# Patient Record
Sex: Female | Born: 1935 | Race: White | Hispanic: No | Marital: Married
Health system: Southern US, Community
[De-identification: ages and names within clinical notes are randomized; demographics above are authoritative.]

## PROBLEM LIST (undated history)

## (undated) DIAGNOSIS — I10 Essential (primary) hypertension: Secondary | ICD-10-CM

---

## 2017-11-19 ENCOUNTER — Encounter (HOSPITAL_COMMUNITY): Payer: Self-pay | Admitting: Pharmacy Technician

## 2017-11-19 ENCOUNTER — Inpatient Hospital Stay (HOSPITAL_COMMUNITY)
Admission: EM | Admit: 2017-11-19 | Discharge: 2017-11-22 | DRG: 698 | Disposition: A | Discharge status: Home / Self Care | Payer: Medicare Other | Attending: Student in an Organized Health Care Education/Training Program | Admitting: Student in an Organized Health Care Education/Training Program

## 2017-11-19 ENCOUNTER — Other Ambulatory Visit: Payer: Self-pay

## 2017-11-19 ENCOUNTER — Emergency Department (HOSPITAL_COMMUNITY): Payer: Medicare Other

## 2017-11-19 DIAGNOSIS — Y846 Urinary catheterization as the cause of abnormal reaction of the patient, or of later complication, without mention of misadventure at the time of the procedure: Secondary | ICD-10-CM | POA: Diagnosis present

## 2017-11-19 DIAGNOSIS — I10 Essential (primary) hypertension: Secondary | ICD-10-CM

## 2017-11-19 DIAGNOSIS — J9811 Atelectasis: Secondary | ICD-10-CM | POA: Diagnosis present

## 2017-11-19 DIAGNOSIS — Z87440 Personal history of urinary (tract) infections: Secondary | ICD-10-CM

## 2017-11-19 DIAGNOSIS — R509 Fever, unspecified: Secondary | ICD-10-CM | POA: Diagnosis not present

## 2017-11-19 DIAGNOSIS — J90 Pleural effusion, not elsewhere classified: Secondary | ICD-10-CM | POA: Diagnosis present

## 2017-11-19 DIAGNOSIS — T83518A Infection and inflammatory reaction due to other urinary catheter, initial encounter: Secondary | ICD-10-CM | POA: Diagnosis not present

## 2017-11-19 DIAGNOSIS — R32 Unspecified urinary incontinence: Secondary | ICD-10-CM | POA: Diagnosis present

## 2017-11-19 DIAGNOSIS — R7881 Bacteremia: Secondary | ICD-10-CM

## 2017-11-19 DIAGNOSIS — N3000 Acute cystitis without hematuria: Secondary | ICD-10-CM

## 2017-11-19 DIAGNOSIS — N390 Urinary tract infection, site not specified: Secondary | ICD-10-CM

## 2017-11-19 DIAGNOSIS — C9211 Chronic myeloid leukemia, BCR/ABL-positive, in remission: Secondary | ICD-10-CM | POA: Diagnosis present

## 2017-11-19 DIAGNOSIS — A419 Sepsis, unspecified organism: Secondary | ICD-10-CM

## 2017-11-19 DIAGNOSIS — Z79899 Other long term (current) drug therapy: Secondary | ICD-10-CM

## 2017-11-19 DIAGNOSIS — A4151 Sepsis due to Escherichia coli [E. coli]: Secondary | ICD-10-CM | POA: Diagnosis present

## 2017-11-19 LAB — COMPREHENSIVE METABOLIC PANEL
ALT: 12 U/L (ref 0–44)
ANION GAP: 5 (ref 5–15)
AST: 19 U/L (ref 15–41)
Albumin: 2.6 g/dL — ABNORMAL LOW (ref 3.5–5.0)
Alkaline Phosphatase: 66 U/L (ref 38–126)
BUN: 7 mg/dL — AB (ref 8–23)
CHLORIDE: 107 mmol/L (ref 98–111)
CO2: 22 mmol/L (ref 22–32)
Calcium: 7.8 mg/dL — ABNORMAL LOW (ref 8.9–10.3)
Creatinine, Ser: 0.83 mg/dL (ref 0.44–1.00)
Glucose, Bld: 124 mg/dL — ABNORMAL HIGH (ref 70–99)
POTASSIUM: 3.5 mmol/L (ref 3.5–5.1)
Sodium: 134 mmol/L — ABNORMAL LOW (ref 135–145)
Total Bilirubin: 0.9 mg/dL (ref 0.3–1.2)
Total Protein: 5.2 g/dL — ABNORMAL LOW (ref 6.5–8.1)

## 2017-11-19 LAB — CBC WITH DIFFERENTIAL/PLATELET
ABS IMMATURE GRANULOCYTES: 0.04 10*3/uL (ref 0.00–0.07)
BASOS ABS: 0 10*3/uL (ref 0.0–0.1)
BASOS PCT: 1 %
Eosinophils Absolute: 0.1 10*3/uL (ref 0.0–0.5)
Eosinophils Relative: 1 %
HCT: 31.7 % — ABNORMAL LOW (ref 36.0–46.0)
HEMOGLOBIN: 9.9 g/dL — AB (ref 12.0–15.0)
IMMATURE GRANULOCYTES: 1 %
Lymphocytes Relative: 2 %
Lymphs Abs: 0.2 10*3/uL — ABNORMAL LOW (ref 0.7–4.0)
MCH: 28.5 pg (ref 26.0–34.0)
MCHC: 31.2 g/dL (ref 30.0–36.0)
MCV: 91.4 fL (ref 80.0–100.0)
Monocytes Absolute: 0.3 10*3/uL (ref 0.1–1.0)
Monocytes Relative: 4 %
NEUTROS ABS: 6.7 10*3/uL (ref 1.7–7.7)
NEUTROS PCT: 91 %
NRBC: 0 % (ref 0.0–0.2)
PLATELETS: 251 10*3/uL (ref 150–400)
RBC: 3.47 MIL/uL — AB (ref 3.87–5.11)
RDW: 13.4 % (ref 11.5–15.5)
WBC: 7.4 10*3/uL (ref 4.0–10.5)

## 2017-11-19 LAB — I-STAT CG4 LACTIC ACID, ED: LACTIC ACID, VENOUS: 1.24 mmol/L (ref 0.5–1.9)

## 2017-11-19 MED ORDER — SODIUM CHLORIDE 0.9 % IV SOLN
2.0000 g | Freq: Once | INTRAVENOUS | Status: AC
  Administered 2017-11-19: 2 g via INTRAVENOUS
  Filled 2017-11-19: qty 2

## 2017-11-19 MED ORDER — LACTATED RINGERS IV BOLUS
1000.0000 mL | Freq: Once | INTRAVENOUS | Status: AC
  Administered 2017-11-19: 1000 mL via INTRAVENOUS

## 2017-11-19 MED ORDER — VANCOMYCIN HCL IN DEXTROSE 1-5 GM/200ML-% IV SOLN
1000.0000 mg | Freq: Once | INTRAVENOUS | Status: AC
  Administered 2017-11-20: 1000 mg via INTRAVENOUS
  Filled 2017-11-19: qty 200

## 2017-11-19 MED ORDER — VANCOMYCIN HCL 500 MG IV SOLR
500.0000 mg | Freq: Two times a day (BID) | INTRAVENOUS | Status: DC
  Administered 2017-11-20: 500 mg via INTRAVENOUS
  Filled 2017-11-19 (×2): qty 500

## 2017-11-19 MED ORDER — SODIUM CHLORIDE 0.9 % IV SOLN
2.0000 g | INTRAVENOUS | Status: DC
  Filled 2017-11-19: qty 2

## 2017-11-19 MED ORDER — IOHEXOL 300 MG/ML  SOLN
100.0000 mL | Freq: Once | INTRAMUSCULAR | Status: AC | PRN
  Administered 2017-11-19: 100 mL via INTRAVENOUS

## 2017-11-19 NOTE — ED Notes (Signed)
Attempted to draw blood culture x2

## 2017-11-19 NOTE — Progress Notes (Signed)
Pharmacy Antibiotic Note  Summer Stephenson is a 84 y.o. female admitted on 11/19/2017 with sepsis secondary to possible UTI.  Pharmacy has been consulted for cefepime and vancomycin dosing.  Currently, febrile (Tmax 103.4), WBC 7.4, CrCl 47.9 mL/min.   Plan: Cefepime 2 gm IV q24h Vancomycin 1 gm IV once, then 500 mg IV q12h Monitor renal function F/u cultures and length of therapy   Height: 5\' 4"  (162.6 cm) Weight: 145 lb (65.8 kg) IBW/kg (Calculated) : 54.7  Temp (24hrs), Avg:103.4 F (39.7 C), Min:103.4 F (39.7 C), Max:103.4 F (39.7 C)  No results for input(s): WBC, CREATININE, LATICACIDVEN, VANCOTROUGH, VANCOPEAK, VANCORANDOM, GENTTROUGH, GENTPEAK, GENTRANDOM, TOBRATROUGH, TOBRAPEAK, TOBRARND, AMIKACINPEAK, AMIKACINTROU, AMIKACIN in the last 168 hours.  CrCl cannot be calculated (No successful lab value found.).    Allergies not on file  Antimicrobials this admission: Cefepime 11/1>> Vancomycin 11/1>>   Microbiology results: 11/1 BCx: 11/1 UCx:  Thank you for allowing pharmacy to be a part of this patient's care.  Willia Craze, Pharmacy Student

## 2017-11-19 NOTE — ED Provider Notes (Signed)
[facility] EMERGENCY DEPARTMENT Provider Note   CSN: [id number] Arrival date & time: 11/19/17  2115     History   Chief Complaint Chief Complaint  Patient presents with  . Fever    HPI Summer Stephenson is a 84 y.o. female.  The history is provided by the patient.  Fever   This is a new problem. The current episode started 6 to 12 hours ago. The problem occurs constantly. The problem has not changed since onset.The maximum temperature noted was 103 to 104 F. Associated symptoms include muscle aches. Pertinent negatives include no chest pain, no fussiness, no sleepiness, no diarrhea, no vomiting, no congestion, no headaches, no sore throat, no tugging at ear and no cough. She has tried nothing for the symptoms. The treatment provided no relief.    History reviewed. No pertinent past medical history.  Patient Active Problem List   Diagnosis Date Noted  . Sepsis ([place]) 11/20/2017    History reviewed. No pertinent surgical history.   OB History   None      Home Medications    Prior to Admission medications   Medication Sig Start Date End Date Taking? Authorizing Provider  acetaminophen (TYLENOL) 325 MG tablet Take 650 mg by mouth every 6 (six) hours as needed for fever.   Yes [provider]  denosumab (PROLIA) 60 MG/ML SOSY injection Inject 60 mg into the skin every 6 (six) months.   Yes [provider]  gabapentin (NEURONTIN) 400 MG capsule Take 400 mg by mouth 2 (two) times daily. 10/20/17  Yes [provider]  lisinopril (PRINIVIL,ZESTRIL) 10 MG tablet Take 10 mg by mouth daily. 09/24/17  Yes [provider]  metoprolol succinate (TOPROL-XL) 25 MG 24 hr tablet Take 25 mg by mouth at bedtime. 10/20/17  Yes [provider]    Family History No family history on file.  Social History Social History   Tobacco Use  . Smoking status: Not on file  Substance Use Topics  . Alcohol use: Not on file  . Drug use: Not  on file     Allergies   Patient has no known allergies.   Review of Systems Review of Systems  Constitutional: Positive for fever. Negative for chills.  HENT: Negative for congestion, ear pain and sore throat.   Eyes: Negative for pain and visual disturbance.  Respiratory: Negative for cough and shortness of breath.   Cardiovascular: Negative for chest pain and palpitations.  Gastrointestinal: Negative for abdominal pain, diarrhea and vomiting.  Genitourinary: Negative for difficulty urinating, dyspareunia, dysuria, flank pain, frequency, hematuria, pelvic pain, urgency, vaginal discharge and vaginal pain.       Suprapubic catheter since recent surgery while in [place] for bladder/vagina fistula repair  Musculoskeletal: Negative for arthralgias and back pain.  Skin: Negative for color change and rash.  Neurological: Negative for seizures, syncope and headaches.  All other systems reviewed and are negative.    Physical Exam Updated Vital Signs  ED Triage Vitals  Enc Vitals Group     BP 11/19/17 2130 (!) 134/56     Pulse Rate 11/19/17 2130 (!) 102     Resp 11/19/17 2130 15     Temp 11/19/17 2133 (!) 103.4 F (39.7 C)     Temp Source 11/19/17 2133 Rectal     SpO2 11/19/17 2130 94 %     Weight 11/19/17 2134 145 lb (65.8 kg)     Height 11/19/17 2134 5\' 4"  (1.626 m)  Head Circumference --      Peak Flow --      Pain Score 11/19/17 2134 6     Pain Loc --      Pain Edu? --      Excl. in [place]? --     Physical Exam  Constitutional: She is oriented to person, place, and time. She appears well-developed and well-nourished. No distress.  HENT:  Head: Normocephalic and atraumatic.  Mouth/Throat: No oropharyngeal exudate.  Eyes: Pupils are equal, round, and reactive to light. Conjunctivae and EOM are normal.  Neck: Normal range of motion. Neck supple.  Cardiovascular: Regular rhythm, normal heart sounds and intact distal pulses. Tachycardia present.  No murmur  heard. Pulmonary/Chest: Effort normal and breath sounds normal. No respiratory distress.  Abdominal: Soft. She exhibits no distension. There is no tenderness.  Suprapubic catheter in place, well-healing surgical scars.  No signs of drainage around surgical site, no fluctuance, no abscess  Musculoskeletal: Normal range of motion. She exhibits no edema.  Neurological: She is alert and oriented to person, place, and time.  Skin: Skin is warm and dry. Capillary refill takes less than 2 seconds.  Psychiatric: She has a normal mood and affect.  Nursing note and vitals reviewed.    ED Treatments / Results  Labs (all labs ordered are listed, but only abnormal results are displayed) Labs Reviewed  COMPREHENSIVE METABOLIC PANEL - Abnormal; Notable for the following components:      Result Value   Sodium 134 (*)    Glucose, Bld 124 (*)    BUN 7 (*)    Calcium 7.8 (*)    Total Protein 5.2 (*)    Albumin 2.6 (*)    All other components within normal limits  CBC WITH DIFFERENTIAL/PLATELET - Abnormal; Notable for the following components:   RBC 3.47 (*)    Hemoglobin 9.9 (*)    HCT 31.7 (*)    Lymphs Abs 0.2 (*)    All other components within normal limits  URINALYSIS, ROUTINE W REFLEX MICROSCOPIC - Abnormal; Notable for the following components:   APPearance HAZY (*)    Hgb urine dipstick LARGE (*)    Protein, ur 30 (*)    Nitrite POSITIVE (*)    Leukocytes, UA LARGE (*)    RBC / HPF >50 (*)    WBC, UA >50 (*)    Bacteria, UA MANY (*)    All other components within normal limits  I-STAT CG4 LACTIC ACID, ED - Abnormal; Notable for the following components:   Lactic Acid, Venous 0.49 (*)    All other components within normal limits  CULTURE, BLOOD (ROUTINE X 2)  CULTURE, BLOOD (ROUTINE X 2)  URINE CULTURE  INFLUENZA PANEL BY PCR (TYPE A & B)  LIPASE, BLOOD  CBC  CREATININE, SERUM  BASIC METABOLIC PANEL  CBC  I-STAT CG4 LACTIC ACID, ED    EKG None  Radiology Dg Chest 2  View  Result Date: 11/20/2017 CLINICAL DATA:  84 year old female with fever EXAM: CHEST - 2 VIEW COMPARISON:  CT of the abdomen pelvis dated 11/19/2017 FINDINGS: Left lung base density may represent atelectasis versus infiltrate. Probable trace left pleural effusion. No pneumothorax. The cardiac silhouette is within normal limits. No acute osseous pathology. IMPRESSION: Left lung base atelectasis versus infiltrate. Electronically Signed   By: Anner Crete M.D.   On: 11/20/2017 00:06   Ct Abdomen Pelvis W Contrast  Result Date: 11/19/2017 CLINICAL DATA:  84 year old female with acute abdominal pain. Fever  and nausea. EXAM: CT ABDOMEN AND PELVIS WITH CONTRAST TECHNIQUE: Multidetector CT imaging of the abdomen and pelvis was performed using the standard protocol following bolus administration of intravenous contrast. CONTRAST:  134mL OMNIPAQUE IOHEXOL 300 MG/ML  SOLN COMPARISON:  None. FINDINGS: Evaluation of this exam is limited in the absence of intravenous contrast. Lower chest: A patchy area of subsegmental consolidation at the left lung base may represent atelectasis or infiltrate. There are otherwise bibasilar linear atelectasis/scarring. No intra-abdominal free air or free fluid. Hepatobiliary: Diffuse fatty infiltration of the liver. No intrahepatic biliary ductal dilatation. Cholecystectomy. Pancreas: There is moderate fatty atrophy of the pancreas. Minimal stranding may be present in the region of the uncinate process of the pancreas. Correlation with pancreatic enzymes recommended to exclude pancreatitis. No drainable fluid collection/abscess or pseudocyst. Spleen: Normal in size without focal abnormality. Adrenals/Urinary Tract: The adrenal glands are unremarkable. There is no hydronephrosis on either side. Subcentimeter and ill-defined right renal hypodense lesions are too small to characterize. There is symmetric enhancement and excretion of contrast by both kidneys. Bilateral pigtail ureteral  stents with proximal tip in the region of the ureteropelvic junctions and distal end within the urinary bladder. The urinary bladder is collapsed around a suprapubic catheter. Diffuse thickened appearance of the bladder wall likely partly related to underdistention and possibly secondary to chronic infection. Clinical correlation is recommended. Stomach/Bowel: There is a small hiatal hernia. There is colonic diverticulosis without active inflammatory changes. There is no bowel obstruction or active inflammation. Normal appendix. Vascular/Lymphatic: Mild aortoiliac atherosclerotic disease. No portal venous gas. There is no adenopathy. Reproductive: Hysterectomy. No pelvic mass. Other: Midline vertical anterior pelvic wall incisional scar and a small fat containing umbilical hernia. Musculoskeletal: L4-L5 disc spacer. No acute osseous pathology. IMPRESSION: 1. Minimal stranding in the region of the uncinate process of the pancreas. Correlation with pancreatic enzymes recommended to exclude pancreatitis. 2. Fatty liver. 3. Colonic diverticulosis without active inflammatory changes. No bowel obstruction or active inflammation. Normal appendix. 4. Bilateral pigtail ureteral stents with proximal tip in the region of the ureteropelvic junctions and distal end within the urinary bladder. No hydronephrosis on either side. 5. Left lung base subsegmental atelectasis or infiltrate. Electronically Signed   By: Anner Crete M.D.   On: 11/19/2017 23:49    Procedures .Critical Care Performed by: Lennice Sites, DO Authorized by: Lennice Sites, DO   Critical care provider statement:    Critical care time (minutes):  55   Critical care time was exclusive of:  Separately billable procedures and treating other patients and teaching time   Critical care was necessary to treat or prevent imminent or life-threatening deterioration of the following conditions:  Sepsis   Critical care was time spent personally by me on the  following activities:  Development of treatment plan with patient or surrogate, discussions with primary provider, evaluation of patient's response to treatment, examination of patient, obtaining history from patient or surrogate, ordering and performing treatments and interventions, ordering and review of laboratory studies, ordering and review of radiographic studies, pulse oximetry, re-evaluation of patient's condition and review of old charts   I assumed direction of critical care for this patient from another provider in my specialty: no     (including critical care time)  Medications Ordered in ED Medications  vancomycin (VANCOCIN) IVPB 1000 mg/200 mL premix (has no administration in time range)  ceFEPIme (MAXIPIME) 2 g in sodium chloride 0.9 % 100 mL IVPB (has no administration in time range)  vancomycin (VANCOCIN) 500  mg in sodium chloride 0.9 % 100 mL IVPB (has no administration in time range)  enoxaparin (LOVENOX) injection 40 mg (has no administration in time range)  acetaminophen (TYLENOL) tablet 650 mg (has no administration in time range)    Or  acetaminophen (TYLENOL) suppository 650 mg (has no administration in time range)  polyethylene glycol (MIRALAX / GLYCOLAX) packet 17 g (has no administration in time range)  ondansetron (ZOFRAN) tablet 4 mg (has no administration in time range)    Or  ondansetron (ZOFRAN) injection 4 mg (has no administration in time range)  lactated ringers bolus 1,000 mL (0 mLs Intravenous Stopped 11/20/17 0034)  ceFEPIme (MAXIPIME) 2 g in sodium chloride 0.9 % 100 mL IVPB (0 g Intravenous Stopped 11/20/17 0034)  iohexol (OMNIPAQUE) 300 MG/ML solution 100 mL (100 mLs Intravenous Contrast Given 11/19/17 2311)     Initial Impression / Assessment and Plan / ED Course  I have reviewed the triage vital signs and the nursing notes.  Pertinent labs & imaging results that were available during my care of the patient were reviewed by me and considered in my  medical decision making (see chart for details).     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  Patient with concern for sepsis.  Has no abdominal tenderness.  Has a urine leg bag attached to her suprapubic catheter that has cloudy urine.  Surgical site is well-appearing.  No abdominal tenderness on exam.  Patient with no cough, no sputum production.  Clear breath sounds.  Given fever and tachycardia and recent surgery patient was empirically started on IV cefepime and IV vancomycin.  Patient was started on lactated Ringer bolus.  Patient had normal blood pressure.  Lactic acid within normal limits.  No significant leukocytosis.  Urinalysis showing signs of urinary tract infection.  Urine culture sent.  Chest x-ray with possible pneumonia.  However clinically likely not pneumonia.  CT scan of the abdomen and pelvis showed no acute findings.  No abscesses or other acute intra-abdominal infectious process.  Patient with some inflammation around the pancreas and will obtain lipase.  Patient otherwise with no significant anemia, electrolyte abnormality, kidney injury.  Flu test pending.  Blood cultures pending.  Patient hemodynamically stable throughout my care.  Patient admitted to internal medicine service for further sepsis work-up.  Medical records from [facility] were obtained and given to inpatient team, likely will need to discuss patient with her surgeon Dr. Lunette Stands over the weekend.  This chart was dictated using voice recognition software.  Despite best efforts to proofread,  errors can occur which can change the documentation meaning.   Final Clinical Impressions(s) / ED Diagnoses   Final diagnoses:  Sepsis, due to unspecified organism, unspecified whether acute organ dysfunction present [facility])  Acute cystitis  without hematuria    ED Discharge Orders    None       Lennice Sites, DO 11/20/17 0117

## 2017-11-19 NOTE — ED Notes (Signed)
Patient transported to CT 

## 2017-11-19 NOTE — ED Triage Notes (Signed)
Pt arrives via ems from home with reports of possible sepsis. NV and ams. Pt recently had bladder mesh removed and urinary catheter placed. 700cc fluid given en route and 4mg  zofran pt with brown emesis with ems. Pt alert to person place and event. 146/72, HR 104, RR 20, capnography 25, 92% RA.

## 2017-11-20 ENCOUNTER — Other Ambulatory Visit: Payer: Self-pay

## 2017-11-20 DIAGNOSIS — J90 Pleural effusion, not elsewhere classified: Secondary | ICD-10-CM | POA: Diagnosis present

## 2017-11-20 DIAGNOSIS — R509 Fever, unspecified: Secondary | ICD-10-CM | POA: Diagnosis present

## 2017-11-20 DIAGNOSIS — J9811 Atelectasis: Secondary | ICD-10-CM | POA: Diagnosis present

## 2017-11-20 DIAGNOSIS — I10 Essential (primary) hypertension: Secondary | ICD-10-CM

## 2017-11-20 DIAGNOSIS — B9620 Unspecified Escherichia coli [E. coli] as the cause of diseases classified elsewhere: Secondary | ICD-10-CM | POA: Diagnosis not present

## 2017-11-20 DIAGNOSIS — Y846 Urinary catheterization as the cause of abnormal reaction of the patient, or of later complication, without mention of misadventure at the time of the procedure: Secondary | ICD-10-CM | POA: Diagnosis present

## 2017-11-20 DIAGNOSIS — R32 Unspecified urinary incontinence: Secondary | ICD-10-CM | POA: Diagnosis present

## 2017-11-20 DIAGNOSIS — N390 Urinary tract infection, site not specified: Secondary | ICD-10-CM | POA: Diagnosis present

## 2017-11-20 DIAGNOSIS — A4151 Sepsis due to Escherichia coli [E. coli]: Secondary | ICD-10-CM | POA: Diagnosis present

## 2017-11-20 DIAGNOSIS — N8110 Cystocele, unspecified: Secondary | ICD-10-CM | POA: Insufficient documentation

## 2017-11-20 DIAGNOSIS — Z79899 Other long term (current) drug therapy: Secondary | ICD-10-CM | POA: Diagnosis not present

## 2017-11-20 DIAGNOSIS — Z9889 Other specified postprocedural states: Secondary | ICD-10-CM

## 2017-11-20 DIAGNOSIS — Z8742 Personal history of other diseases of the female genital tract: Secondary | ICD-10-CM

## 2017-11-20 DIAGNOSIS — R7881 Bacteremia: Secondary | ICD-10-CM | POA: Diagnosis not present

## 2017-11-20 DIAGNOSIS — C9211 Chronic myeloid leukemia, BCR/ABL-positive, in remission: Secondary | ICD-10-CM | POA: Diagnosis present

## 2017-11-20 DIAGNOSIS — T83518A Infection and inflammatory reaction due to other urinary catheter, initial encounter: Secondary | ICD-10-CM | POA: Diagnosis present

## 2017-11-20 DIAGNOSIS — T83511A Infection and inflammatory reaction due to indwelling urethral catheter, initial encounter: Secondary | ICD-10-CM | POA: Diagnosis not present

## 2017-11-20 DIAGNOSIS — Z87440 Personal history of urinary (tract) infections: Secondary | ICD-10-CM | POA: Diagnosis not present

## 2017-11-20 DIAGNOSIS — A419 Sepsis, unspecified organism: Secondary | ICD-10-CM | POA: Diagnosis present

## 2017-11-20 DIAGNOSIS — N3942 Incontinence without sensory awareness: Secondary | ICD-10-CM

## 2017-11-20 DIAGNOSIS — Z960 Presence of urogenital implants: Secondary | ICD-10-CM | POA: Insufficient documentation

## 2017-11-20 DIAGNOSIS — C9210 Chronic myeloid leukemia, BCR/ABL-positive, not having achieved remission: Secondary | ICD-10-CM | POA: Insufficient documentation

## 2017-11-20 LAB — URINALYSIS, ROUTINE W REFLEX MICROSCOPIC
BILIRUBIN URINE: NEGATIVE
Glucose, UA: NEGATIVE mg/dL
KETONES UR: NEGATIVE mg/dL
Nitrite: POSITIVE — AB
PH: 7 (ref 5.0–8.0)
PROTEIN: 30 mg/dL — AB
Specific Gravity, Urine: 1.011 (ref 1.005–1.030)

## 2017-11-20 LAB — LIPASE, BLOOD: Lipase: 20 U/L (ref 11–51)

## 2017-11-20 LAB — BLOOD CULTURE ID PANEL (REFLEXED)
ACINETOBACTER BAUMANNII: NOT DETECTED
CANDIDA ALBICANS: NOT DETECTED
CANDIDA PARAPSILOSIS: NOT DETECTED
Candida glabrata: NOT DETECTED
Candida krusei: NOT DETECTED
Candida tropicalis: NOT DETECTED
Carbapenem resistance: NOT DETECTED
Enterobacter cloacae complex: NOT DETECTED
Enterobacteriaceae species: DETECTED — AB
Enterococcus species: NOT DETECTED
Escherichia coli: DETECTED — AB
HAEMOPHILUS INFLUENZAE: NOT DETECTED
KLEBSIELLA PNEUMONIAE: NOT DETECTED
Klebsiella oxytoca: NOT DETECTED
Listeria monocytogenes: NOT DETECTED
NEISSERIA MENINGITIDIS: NOT DETECTED
PROTEUS SPECIES: NOT DETECTED
Pseudomonas aeruginosa: NOT DETECTED
STAPHYLOCOCCUS AUREUS BCID: NOT DETECTED
STREPTOCOCCUS AGALACTIAE: NOT DETECTED
STREPTOCOCCUS SPECIES: NOT DETECTED
Serratia marcescens: NOT DETECTED
Staphylococcus species: NOT DETECTED
Streptococcus pneumoniae: NOT DETECTED
Streptococcus pyogenes: NOT DETECTED

## 2017-11-20 LAB — CBC
HEMATOCRIT: 28.9 % — AB (ref 36.0–46.0)
Hemoglobin: 9.1 g/dL — ABNORMAL LOW (ref 12.0–15.0)
MCH: 28.3 pg (ref 26.0–34.0)
MCHC: 31.5 g/dL (ref 30.0–36.0)
MCV: 90 fL (ref 80.0–100.0)
Platelets: 254 10*3/uL (ref 150–400)
RBC: 3.21 MIL/uL — ABNORMAL LOW (ref 3.87–5.11)
RDW: 13.3 % (ref 11.5–15.5)
WBC: 7.6 10*3/uL (ref 4.0–10.5)
nRBC: 0 % (ref 0.0–0.2)

## 2017-11-20 LAB — BASIC METABOLIC PANEL
Anion gap: 5 (ref 5–15)
BUN: 6 mg/dL — AB (ref 8–23)
CHLORIDE: 106 mmol/L (ref 98–111)
CO2: 23 mmol/L (ref 22–32)
CREATININE: 0.78 mg/dL (ref 0.44–1.00)
Calcium: 7.9 mg/dL — ABNORMAL LOW (ref 8.9–10.3)
GFR calc Af Amer: >60 mL/min (ref >60)
GFR calc non Af Amer: >60 mL/min (ref >60)
Glucose, Bld: 168 mg/dL — ABNORMAL HIGH (ref 70–99)
Potassium: 3.4 mmol/L — ABNORMAL LOW (ref 3.5–5.1)
SODIUM: 134 mmol/L — AB (ref 135–145)

## 2017-11-20 LAB — MRSA PCR SCREENING: MRSA BY PCR: NEGATIVE

## 2017-11-20 LAB — INFLUENZA PANEL BY PCR (TYPE A & B)
INFLAPCR: NEGATIVE
Influenza B By PCR: NEGATIVE

## 2017-11-20 LAB — I-STAT CG4 LACTIC ACID, ED: LACTIC ACID, VENOUS: 0.49 mmol/L — AB (ref 0.5–1.9)

## 2017-11-20 LAB — GLUCOSE, CAPILLARY: GLUCOSE-CAPILLARY: 105 mg/dL — AB (ref 70–99)

## 2017-11-20 MED ORDER — ONDANSETRON HCL 4 MG/2ML IJ SOLN: 4.0000 mg | Freq: Four times a day (QID) | INTRAMUSCULAR | Status: DC | PRN

## 2017-11-20 MED ORDER — ONDANSETRON HCL 4 MG PO TABS: 4.0000 mg | ORAL_TABLET | Freq: Four times a day (QID) | ORAL | Status: DC | PRN

## 2017-11-20 MED ORDER — ACETAMINOPHEN 650 MG RE SUPP: 650.0000 mg | Freq: Four times a day (QID) | RECTAL | Status: DC | PRN

## 2017-11-20 MED ORDER — METOPROLOL SUCCINATE ER 25 MG PO TB24
25.0000 mg | ORAL_TABLET | Freq: Every day | ORAL | Status: DC
  Administered 2017-11-20 – 2017-11-21 (×3): 25 mg via ORAL
  Filled 2017-11-20 (×3): qty 1

## 2017-11-20 MED ORDER — ACETAMINOPHEN 325 MG PO TABS
650.0000 mg | ORAL_TABLET | Freq: Four times a day (QID) | ORAL | Status: DC | PRN
  Administered 2017-11-20 – 2017-11-21 (×4): 650 mg via ORAL
  Filled 2017-11-20 (×4): qty 2

## 2017-11-20 MED ORDER — SODIUM CHLORIDE 0.9 % IV SOLN
2.0000 g | INTRAVENOUS | Status: DC
  Administered 2017-11-20 – 2017-11-21 (×2): 2 g via INTRAVENOUS
  Filled 2017-11-20 (×3): qty 20

## 2017-11-20 MED ORDER — POLYETHYLENE GLYCOL 3350 17 G PO PACK: 17.0000 g | PACK | Freq: Every day | ORAL | Status: DC | PRN

## 2017-11-20 MED ORDER — GABAPENTIN 400 MG PO CAPS
400.0000 mg | ORAL_CAPSULE | Freq: Two times a day (BID) | ORAL | Status: DC
  Administered 2017-11-20 – 2017-11-22 (×6): 400 mg via ORAL
  Filled 2017-11-20 (×6): qty 1

## 2017-11-20 MED ORDER — LISINOPRIL 10 MG PO TABS
10.0000 mg | ORAL_TABLET | Freq: Every day | ORAL | Status: DC
  Administered 2017-11-20 – 2017-11-22 (×3): 10 mg via ORAL
  Filled 2017-11-20 (×3): qty 1

## 2017-11-20 MED ORDER — ENOXAPARIN SODIUM 40 MG/0.4ML ~~LOC~~ SOLN
40.0000 mg | SUBCUTANEOUS | Status: DC
  Administered 2017-11-20 – 2017-11-22 (×3): 40 mg via SUBCUTANEOUS
  Filled 2017-11-20 (×3): qty 0.4

## 2017-11-20 NOTE — H&P (Signed)
Date: 11/20/2017               Patient Name:  Summer Stephenson MRN: [id number]  DOB: 02/09/1932 Age / Sex: 84 y.o., female   PCP: System, Pcp Not In         Medical Service: Internal Medicine Teaching Service         Attending Physician: Dr. [place] Quivira, Rachel Moulds, DO    First Contact: Dr. Sharon Seller Pager: [phone]  Second Contact: Dr. Kalman Shan Pager: [phone]       After Hours (After 5p/  First Contact Pager: [phone]  weekends / holidays): Second Contact Pager: [phone]   Chief Complaint: Fever  History of Present Illness:   [name] is a pleasant 84 year old woman who recently moved from [place] with medical history significant for CML-off all chemotherapy , hypertension and bladder prolapse.  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  She was recently admitted to [facility] from 10/29/2017-11/11/2017 for exploratory laparotomy, abdominal vesicovaginal fistula repair, excision of vaginal and bladder mesh, omental flap transposition and retrograde bilateral ureteral stent placement.  She recently had follow-up with urologist on Monday, underwent cystoscopy and reports she was told of slow wound healing process.  Reported to the ED with 1 day complaint of subjective fevers and chills with T-max of 102 which was unresponsive to Tylenol.  She reports of ongoing urinary incontinence and denies dysuria, urinary frequency, urinary urgency, suprapubic pain and drainage from the supra pubic catheter site.  She has also had multiple urinary tract infections in the past year and has been on several antibiotics including Macrobid, Bactrim, levofloxacin, ciprofloxacin, doxycycline and cefdinir.   ED course: Febrile to 103.4, tachycardic to 102, BP 134/56, CBC without leukocytosis, lactic acid within normal limits, urinalysis with positive nitrites, leukocytes, WBC, RBC, hemoglobin and many  bacteria, CT abdomen and pelvis with no acute intra-abdominal findings, checks x-ray with possible left lower lobe atelectasis versus infiltration.  Patient was started on IV lactic Ringer's, IV cefepime and vancomycin  Meds:   Current Meds  Medication Sig  . acetaminophen (TYLENOL) 325 MG tablet Take 650 mg by mouth every 6 (six) hours as needed for fever.  . denosumab (PROLIA) 60 MG/ML SOSY injection Inject 60 mg into the skin every 6 (six) months.  . gabapentin (NEURONTIN) 400 MG capsule Take 400 mg by mouth 2 (two) times daily.  Marland Kitchen lisinopril (PRINIVIL,ZESTRIL) 10 MG tablet Take 10 mg by mouth daily.  . metoprolol succinate (TOPROL-XL) 25 MG 24 hr tablet Take 25 mg by mouth at bedtime.     Allergies: Allergies as of 11/19/2017  . (No Known Allergies)   History reviewed. No pertinent past medical history.  Family History: Denies family history of bladder cancer  Social History: Denies tobacco use, EtOH or illicit drug use  Review of Systems: A complete ROS was negative except as per HPI.   Physical Exam: Blood pressure (!) 122/45, pulse 83, temperature (!) 103.4 F (39.7 C), temperature source Rectal, resp. rate 14, height 5\' 4"  (1.626 m), weight 65.8 kg, SpO2 94 %. Physical Exam  Constitutional: She is oriented to person, place, and time. No distress.  HENT:  Head: Normocephalic and atraumatic.  Eyes: Conjunctivae and EOM are normal. Right eye exhibits no discharge. Left eye exhibits no discharge. No scleral icterus.  Neck: Neck supple.  Cardiovascular: Normal rate, regular rhythm and normal heart sounds. Exam reveals no gallop and no friction rub.  No murmur heard. Pulmonary/Chest: Effort normal and breath sounds normal. No respiratory distress. She has no wheezes. She has no rales.  Abdominal: Soft. Bowel sounds are normal. She exhibits no distension. There is no tenderness. There is no rebound and no guarding.  -4-5cm midline incision from umbilicus to the suprapubic  area.  -Wound is clean, dry, indurated -Suprapubic catheter site of the left suprapubic area with mild erythema and mild purulent drainage -Abdomen nontender to palpation -Suprapubic nontender to palpation  Neurological: She is alert and oriented to person, place, and time. GCS score is 15.  Skin: Skin is warm. She is not diaphoretic.  Psychiatric: Mood, memory, affect and judgment normal.    EKG: personally reviewed my interpretation is sinus tachycardia  CXR: personally reviewed my interpretation is left lower lobe atelectasis  Assessment & Plan by Problem: Active Problems:   Sepsis [facility])  [name] is a pleasant 84 year old woman with medical history significant for bladder prolapse, urinary incontinence, vaginal fistula s/p exploratory laparotomy, abdominal vesicovaginal fistula repair, excision of vaginal and bladder mesh, omental flap transposition and retrograde bilateral ureteral stent placement, CML -off all chemotherapy and hypertension here for management of sepsis.  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  She presents to the ED with a 1 day history of subjective fever and chills with measured T-max of 102.  Denies dysuria, urinary frequency, urinary urgency though she has chronic urinary incontinence.  On arrival, she was febrile to 103.4, tachycardic to 102, CBC without leukocytosis, lactic acid within normal limit, urinalysis with positive leukocytes, nitrites, hemoglobin, RBC, WBC and rare bacteria. -s/p 1 L lactated Ringer's -s/p IV cefepime and vancomycin --> will continue cefepime and vancomycin and narrow pending urine culture -Follow-up a.m. CBC, urine culture  Hypertension: Continue metoprolol lisinopril  Chronic myeloid leukemia: Reports her  oncologist took her off oral chemotherapy medication 5 weeks ago due to reassuring lab values  Left lower lobe atelectasis: Encourage incentive spirometer  Rule out pancreatitis: Though very low suspicion with history and physical exam however CT abdomen and pelvis with evidence of minimal stranding in the region of the pancreatic uncinate process -Follow-up lipase   FEN: Replace electrolytes as needed, heart healthy diet VTE prophylaxis: SubQ Lovenox CODE STATUS: Full code  Dispo: Admit patient to Inpatient with expected length of stay greater than 2 midnights.  Signed: Jean Rosenthal, MD 11/20/2017, 1:22 AM  Pager: [phone] IMTS PGY-1

## 2017-11-20 NOTE — Evaluation (Signed)
Physical Therapy Evaluation Patient Details Name: Summer Stephenson MRN: [id number] DOB: Feb 14, 1932 Today's Date: 11/20/2017   History of Present Illness  84 yo female with onset of recent vesicovaginal fistula repair with mesh removal was admitted for UTI with suprapubic cath in place.  Had treatment with chemo for CML and now is done, but has potentially pancreatitis, sepsis and recurrent UTI's complicating her history.  PMHx:  fatty liver, CML, L lung atelectasis, stranding pancreas, diverticulosis, uretural stents, HTN  Clinical Impression  Pt was able to walk a short trip with PT today, but is requiring monitoring of O2 sats. Sats were controlled with a low of 92% and then back to baseline.  Her difficulty is recent abd surgery and attendant weakness but is recovering functionally.  Follow acutely for gait and there ex as tolerated to decrease need to stay in rehab after hospital.  Following for same.    Follow Up Recommendations SNF    Equipment Recommendations  Rolling walker with 5" wheels    Recommendations for Other Services       Precautions / Restrictions Precautions Precautions: Fall Restrictions Weight Bearing Restrictions: No      Mobility  Bed Mobility Overal bed mobility: Needs Assistance Bed Mobility: Supine to Sit     Supine to sit: Min assist     General bed mobility comments: min assist to roll and support to sit on side of bed  Transfers Overall transfer level: Needs assistance Equipment used: Rolling walker (2 wheeled);1 person hand held assist Transfers: Sit to/from Stand Sit to Stand: Min assist         General transfer comment: cues for hand placement every trial  Ambulation/Gait Ambulation/Gait assistance: Min guard Gait Distance (Feet): 5 Feet Assistive device: Rolling walker (2 wheeled);1 person hand held assist Gait Pattern/deviations: Step-through pattern;Decreased stride length;Shuffle;Narrow base of support;Trunk flexed Gait velocity:  reduced Gait velocity interpretation: <1.8 ft/sec, indicate of risk for recurrent falls    Stairs            Wheelchair Mobility    Modified Rankin (Stroke Patients Only)       Balance Overall balance assessment: Needs assistance Sitting-balance support: Bilateral upper extremity supported;Feet supported Sitting balance-Leahy Scale: Good     Standing balance support: Bilateral upper extremity supported;During functional activity Standing balance-Leahy Scale: Fair Standing balance comment: less than fair dynamic support                             Pertinent Vitals/Pain Pain Assessment: Faces Faces Pain Scale: Hurts a little bit Pain Location: surgical site Pain Descriptors / Indicators: Operative site guarding Pain Intervention(s): Limited activity within patient's tolerance;Monitored during session    [place] expects to be discharged to:: Private residence Living Arrangements: Spouse/significant other Available Help at Discharge: Family Type of Home: House Home Access: Level entry     Home Layout: One level Home Equipment: Environmental consultant - 2 wheels;Cane - single point;Shower seat      Prior Function Level of Independence: Independent with assistive device(s)               Hand Dominance   Dominant Hand: Right    Extremity/Trunk Assessment   Upper Extremity Assessment Upper Extremity Assessment: Overall WFL for tasks assessed    Lower Extremity Assessment Lower Extremity Assessment: Generalized weakness    Cervical / Trunk Assessment Cervical / Trunk Assessment: Normal  Communication   Communication: No difficulties  Cognition Arousal/Alertness: Awake/alert  Behavior During Therapy: WFL for tasks assessed/performed Overall Cognitive Status: Within Functional Limits for tasks assessed                                 General Comments: pt following instructions well about getting up to the chair and  returning to sitting in chair      General Comments      Exercises     Assessment/Plan    PT Assessment Patient needs continued PT services  PT Problem List Decreased strength;Decreased range of motion;Decreased activity tolerance;Decreased balance;Decreased mobility;Decreased coordination;Decreased knowledge of use of DME;Decreased safety awareness;Cardiopulmonary status limiting activity;Pain;Decreased skin integrity       PT Treatment Interventions DME instruction;Gait training;Stair training;Functional mobility training;Therapeutic activities;Therapeutic exercise;Balance training;Neuromuscular re-education;Cognitive remediation    PT Goals (Current goals can be found in the Care Plan section)  Acute Rehab PT Goals Patient Stated Goal: To walk and get home with husband PT Goal Formulation: With patient Time For Goal Achievement: 12/04/17 Potential to Achieve Goals: Good    Frequency Min 2X/week   Barriers to discharge Decreased caregiver support(home with husband who is 52) husband is older and pt requires help for all movement    Co-evaluation               AM-PAC PT "6 Clicks" Daily Activity  Outcome Measure Difficulty turning over in bed (including adjusting bedclothes, sheets and blankets)?: Unable Difficulty moving from lying on back to sitting on the side of the bed? : Unable Difficulty sitting down on and standing up from a chair with arms (e.g., wheelchair, bedside commode, etc,.)?: Unable Help needed moving to and from a bed to chair (including a wheelchair)?: A Little Help needed walking in hospital room?: A Little Help needed climbing 3-5 steps with a railing? : Total 6 Click Score: 10    End of Session Equipment Utilized During Treatment: Gait belt Activity Tolerance: Patient tolerated treatment well;Patient limited by fatigue Patient left: in chair;with call bell/phone within reach;Other (comment)(O2 sats were being monitored) Nurse Communication:  Mobility status(purwick removed) PT Visit Diagnosis: Unsteadiness on feet (R26.81);Muscle weakness (generalized) (M62.81);Difficulty in walking, not elsewhere classified (R26.2);Pain Pain - part of body: (abdominal surgery)    Time: 3845-3646 PT Time Calculation (min) (ACUTE ONLY): 29 min   Charges:   PT Evaluation $PT Eval Moderate Complexity: 1 Mod PT Treatments $Gait Training: 8-22 mins       Ramond Dial 11/20/2017, 3:51 PM   Mee Hives, PT MS Acute Rehab Dept. Number: [place] and [place]

## 2017-11-20 NOTE — Progress Notes (Addendum)
   Subjective:  States she is feeling much better today and has not had fever or chills overnight. No nausea or abdominal pain and was eating her breakfast on our visit this morning. She denies SOB, dysuria, or pain at her catheter site.   Husband at bedside. Primary urologist and surgery at [facility] in [place]. The couple recently moved here to be closer to their son and now live in [place]. They state most recent   Objective:  Vital signs in last 24 hours: Vitals:   11/20/17 0249 11/20/17 0400 11/20/17 0853 11/20/17 0900  BP: (!) 116/51  (!) 126/49   Pulse: 69 88    Resp: 20     Temp: 98.4 F (36.9 C)   98.6 F (37 C)  TempSrc: Oral     SpO2: 94% 95%    Weight:      Height:       Physical Exam:  Constitution: NAD, lying supine in bed, appears stated age  Cardio: RRR, no m/r/g Respiratory: soft bibasilar crackles, otherwise CTAB Abdominal: NTTP, soft, non-distended MSK: no pitting edema GU: urine yellow, suprapubic catheter place with surrounding erythema but no purulent drainage Neuro: a&o, pleasant Skin: c/d/i    Assessment/Plan:  Active Problems:   Sepsis ([place])   Complicated UTI (urinary tract infection)  84yo female with PMH of CML, UTIs, urinary incontinence past 6 months, ureteral stents, multiple surgeries & mesh placement for bladder prolapse complicated by vesico-vaginal fistula, now s/p ex-lap for repair of fistula and mesh excision during recent hospital stay 10/11 to 10/24 at [facility].   Complicated UTI: Resolution of sepsis symptoms at this time. She continues to have incontinence but without dysuria. Her surgeon advised that after some time she may stop having incontinence s/p fistula repair. She is supposed to follow-up with her surgeon on 11/12.   - cont. Vanc/cefepime - urine and blood cultures pending - am CBC, CMP  Atelectasis: initial CXR showed some small LLL effusion, soft bibasilar crackles, no SOB and on RA. She has been more sedentary  due to recent hospital admission and this is likely the result of her atelectasis.   - incentive spiromter  CML: completed treatment 5 week ago.   Diet: heart healthy IVF: none VTE: lovenox Code: Full  Dispo: Anticipated discharge in approximately 2 days.   Molli Hazard A, DO 11/20/2017, 11:21 AM Pager: [phone]

## 2017-11-20 NOTE — Plan of Care (Signed)
  Problem: Respiratory: Goal: Ability to maintain adequate ventilation will improve Outcome: Progressing   Problem: Clinical Measurements: Goal: Diagnostic test results will improve Outcome: Progressing Goal: Signs and symptoms of infection will decrease Outcome: Progressing   Problem: Fluid Volume: Goal: Hemodynamic stability will improve Outcome: Progressing   Problem: Skin Integrity: Goal: Risk for impaired skin integrity will decrease Outcome: Progressing   Problem: Safety: Goal: Ability to remain free from injury will improve Outcome: Progressing   Problem: Pain Managment: Goal: General experience of comfort will improve Outcome: Progressing   Problem: Elimination: Goal: Will not experience complications related to bowel motility Outcome: Progressing Goal: Will not experience complications related to urinary retention Outcome: Progressing   Problem: Coping: Goal: Level of anxiety will decrease Outcome: Progressing   Problem: Nutrition: Goal: Adequate nutrition will be maintained Outcome: Progressing   Problem: Activity: Goal: Risk for activity intolerance will decrease Outcome: Progressing   Problem: Clinical Measurements: Goal: Ability to maintain clinical measurements within normal limits will improve Outcome: Progressing Goal: Will remain free from infection Outcome: Progressing Goal: Diagnostic test results will improve Outcome: Progressing Goal: Respiratory complications will improve Outcome: Progressing Goal: Cardiovascular complication will be avoided Outcome: Progressing   Problem: Education: Goal: Knowledge of General Education information will improve Description Including pain rating scale, medication(s)/side effects and non-pharmacologic comfort measures Outcome: Progressing   Problem: Health Behavior/Discharge Planning: Goal: Ability to manage health-related needs will improve Outcome: Progressing

## 2017-11-20 NOTE — Plan of Care (Signed)
  Problem: Education: Goal: Knowledge of General Education information will improve Description: Including pain rating scale, medication(s)/side effects and non-pharmacologic comfort measures Outcome: Progressing   Problem: Activity: Goal: Risk for activity intolerance will decrease Outcome: Progressing   Problem: Elimination: Goal: Will not experience complications related to urinary retention Outcome: Progressing   Problem: Pain Managment: Goal: General experience of comfort will improve Outcome: Progressing   Problem: Safety: Goal: Ability to remain free from injury will improve Outcome: Progressing   Problem: Skin Integrity: Goal: Risk for impaired skin integrity will decrease Outcome: Progressing   

## 2017-11-20 NOTE — Progress Notes (Signed)
PHARMACY - PHYSICIAN COMMUNICATION CRITICAL VALUE ALERT - BLOOD CULTURE IDENTIFICATION (BCID)  Summer Stephenson is an 84 y.o. female who presented to [facility] on 11/19/2017 with fever  Assessment:  Complicated UTI. Blood cultures show GNR 1/2 and BCID shows e coli  Name of physician (or Provider) Contacted: Dr Koleen Distance  Current antibiotics: vancomycin and cefepime  Changes to prescribed antibiotics recommended:  -discontinue vancomycin and cefepime -Begin ceftriaxone 2gm IV q24h  Results for orders placed or performed during the hospital encounter of 11/19/17  Blood Culture ID Panel (Reflexed) (Collected: 11/19/2017 10:41 PM)  Result Value Ref Range   Enterococcus species NOT DETECTED NOT DETECTED   Listeria monocytogenes NOT DETECTED NOT DETECTED   Staphylococcus species NOT DETECTED NOT DETECTED   Staphylococcus aureus (BCID) NOT DETECTED NOT DETECTED   Streptococcus species NOT DETECTED NOT DETECTED   Streptococcus agalactiae NOT DETECTED NOT DETECTED   Streptococcus pneumoniae NOT DETECTED NOT DETECTED   Streptococcus pyogenes NOT DETECTED NOT DETECTED   Acinetobacter baumannii NOT DETECTED NOT DETECTED   Enterobacteriaceae species DETECTED (A) NOT DETECTED   Enterobacter cloacae complex NOT DETECTED NOT DETECTED   Escherichia coli DETECTED (A) NOT DETECTED   Klebsiella oxytoca NOT DETECTED NOT DETECTED   Klebsiella pneumoniae NOT DETECTED NOT DETECTED   Proteus species NOT DETECTED NOT DETECTED   Serratia marcescens NOT DETECTED NOT DETECTED   Carbapenem resistance NOT DETECTED NOT DETECTED   Haemophilus influenzae NOT DETECTED NOT DETECTED   Neisseria meningitidis NOT DETECTED NOT DETECTED   Pseudomonas aeruginosa NOT DETECTED NOT DETECTED   Candida albicans NOT DETECTED NOT DETECTED   Candida glabrata NOT DETECTED NOT DETECTED   Candida krusei NOT DETECTED NOT DETECTED   Candida parapsilosis NOT DETECTED NOT DETECTED   Candida tropicalis NOT DETECTED NOT DETECTED     Hildred Laser, PharmD Clinical Pharmacist **Pharmacist phone directory can now be found on [email or web address] ([email or web address]).  Listed under [place].

## 2017-11-21 DIAGNOSIS — R7881 Bacteremia: Secondary | ICD-10-CM

## 2017-11-21 DIAGNOSIS — B9620 Unspecified Escherichia coli [E. coli] as the cause of diseases classified elsewhere: Secondary | ICD-10-CM

## 2017-11-21 LAB — CBC
HEMATOCRIT: 31 % — AB (ref 36.0–46.0)
Hemoglobin: 9.4 g/dL — ABNORMAL LOW (ref 12.0–15.0)
MCH: 27.7 pg (ref 26.0–34.0)
MCHC: 30.3 g/dL (ref 30.0–36.0)
MCV: 91.4 fL (ref 80.0–100.0)
Platelets: 226 10*3/uL (ref 150–400)
RBC: 3.39 MIL/uL — ABNORMAL LOW (ref 3.87–5.11)
RDW: 13.4 % (ref 11.5–15.5)
WBC: 3.2 10*3/uL — AB (ref 4.0–10.5)
nRBC: 0 % (ref 0.0–0.2)

## 2017-11-21 LAB — COMPREHENSIVE METABOLIC PANEL
ALT: 11 U/L (ref 0–44)
ANION GAP: 4 — AB (ref 5–15)
AST: 15 U/L (ref 15–41)
Albumin: 2.3 g/dL — ABNORMAL LOW (ref 3.5–5.0)
Alkaline Phosphatase: 57 U/L (ref 38–126)
BILIRUBIN TOTAL: 0.1 mg/dL — AB (ref 0.3–1.2)
BUN: 5 mg/dL — ABNORMAL LOW (ref 8–23)
CALCIUM: 8.2 mg/dL — AB (ref 8.9–10.3)
CO2: 25 mmol/L (ref 22–32)
Chloride: 108 mmol/L (ref 98–111)
Creatinine, Ser: 0.77 mg/dL (ref 0.44–1.00)
GFR calc non Af Amer: >60 mL/min (ref >60)
Glucose, Bld: 93 mg/dL (ref 70–99)
POTASSIUM: 3.5 mmol/L (ref 3.5–5.1)
Sodium: 137 mmol/L (ref 135–145)
TOTAL PROTEIN: 5.1 g/dL — AB (ref 6.5–8.1)

## 2017-11-21 LAB — GLUCOSE, CAPILLARY: Glucose-Capillary: 138 mg/dL — ABNORMAL HIGH (ref 70–99)

## 2017-11-21 NOTE — Progress Notes (Signed)
   Subjective:  Ms. Bruhl had a high grade fever of 102 last night. This morning she is afebrile and feeling better than yesterday. She was able to get out of bed yesterday and sat in the chair for a few hours.   Objective:  Vital signs in last 24 hours: Vitals:   11/20/17 1925 11/20/17 2133 11/21/17 0534 11/21/17 0652  BP:  (!) 120/54 (!) 97/51 (!) 131/57  Pulse:  82 66   Resp:  16    Temp: (!) 102.7 F (39.3 C) 100 F (37.8 C) 98.4 F (36.9 C)   TempSrc:  Oral Oral   SpO2:  95% 95%   Weight:      Height:       Physical Exam  Constitutional: She is oriented to person, place, and time and well-developed, well-nourished, and in no distress.  Cardiovascular: Normal rate, regular rhythm and normal heart sounds. Exam reveals no gallop and no friction rub.  No murmur heard. Pulmonary/Chest: Effort normal and breath sounds normal. No respiratory distress.  Abdominal: Soft. Bowel sounds are normal. She exhibits no distension. There is no tenderness.  Mild erythema around suprapubic catheter, but does not appear infected. No purulence noted.   Musculoskeletal: She exhibits no edema.  Neurological: She is alert and oriented to person, place, and time.    Assessment/Plan:  Principal Problem:   E coli bacteremia Active Problems:   Complicated UTI (urinary tract infection)   Essential hypertension  Ms. Pickford is a 84 yo female with history of CML in remission, recurrent UTIs, urinary incontinence, ureteral stents, bladder prolapse s/p multiple surgeries & mesh placement  complicated by vesico-vaginal fistula, now s/p ex-lap for repair of fistula and mesh excision during recent hospital stay 10/11 to 10/24 at [facility].   # E coli bacteremia, recent GU surgical procedure likely source  # Complicate UTI  Ms. Pellicane is feeling better this morning. She had a high grade fever of 102 at 7PM last night and has been afebrile since then. Remains hemodynamically stable. Her Bcx grew E. Coli in 1/4  bottles and she was switched from vanc/cefepime to CTX overnight. Ucx grew GNRs, but was re-incubated for better growth. We will continue IV antibiotic therapy with Rocephin pending sensitivities.  - Continue Rocephin  - Follow up  Bcx and Ucx  - Follow up fever curve  - Has outpatient follow up with urology next week in [place]: Anticipated discharge in approximately 1-3 day(s).   Welford Roche, MD 11/21/2017, 9:33 AM Pager: [phone]

## 2017-11-21 NOTE — Clinical Social Work Note (Signed)
Clinical Social Work Assessment  Patient Details  Name: Summer Stephenson MRN: [id number] Date of Birth: May 06, 1932  Date of referral:  11/21/17               Reason for consult:  Facility Placement                Permission sought to share information with:  Family Supports Permission granted to share information::     Name::        Agency::     Relationship::     Contact Information:     Housing/Transportation Living arrangements for the past 2 months:  Single Family Home Source of Information:  Patient Patient Interpreter Needed:  None Criminal Activity/Legal Involvement Pertinent to Current Situation/Hospitalization:  No - Comment as needed Significant Relationships:  Adult Children, Spouse Lives with:  Spouse Do you feel safe going back to the place where you live?  Yes Need for family participation in patient care:  Yes (Comment)  Care giving concerns:  Pt is alert and oriented.   Social Worker assessment / plan:  CSW spoke with pt at bedside. At this time pt is refusing SNF. Pt states she worked with PT yesterday and she feels she is getting around just as well as she was prior to admission. Pt states her husband lives with her and he is very capable of helping her. Pt is encouraged to think about it and let CSW know if she changes her mind. RNCM notified.     Employment status:  Retired Nurse, adult PT Recommendations:  [place] / Referral to community resources:  [place]  Patient/Family's Response to care:  Pt verbalized understanding of CSW role and expressed appreciation for support. Pt denies any concern regarding pt care at this time.   Patient/Family's Understanding of and Emotional Response to Diagnosis, Current Treatment, and Prognosis:  At this time pt is refusing SNF. Pt denies any concerns at this time.   Clinical Social Worker will sign off for now as social work intervention is no longer  needed. Please consult [place] again if new need arises.     Emotional Assessment Appearance:  Appears stated age Attitude/Demeanor/Rapport:  (Patient was appropriate) Affect (typically observed):  Accepting, Appropriate, Calm Orientation:  Oriented to Place, Oriented to Self, Oriented to  Time, Oriented to Situation Alcohol / Substance use:  Not Applicable Psych involvement (Current and /or in the community):  No (Comment)  Discharge Needs  Concerns to be addressed:  Patient refuses services, Basic Needs, Care Coordination Readmission within the last 30 days:  No Current discharge risk:  Dependent with Mobility Barriers to Discharge:  Continued Medical Work up   [employer], LCSW 11/21/2017, 9:51 AM

## 2017-11-21 NOTE — Progress Notes (Signed)
Notified MD of patient's temperature of 100.58F. MD advised RN that this temperature if acceptable considering the overall picture of the patient's diagnosis. Since then the patient's temperature was retaken and is 98.40F. Also notified MD of patient's blood pressure 97/51. Awaiting return call. Will continue to monitor.

## 2017-11-21 NOTE — Evaluation (Signed)
Occupational Therapy Evaluation Patient Details Name: Summer Stephenson MRN: [id number] DOB: [birth year] Today's Date: 11/21/2017    History of Present Illness 84 yo female with onset of recent vesicovaginal fistula repair with mesh removal was admitted for UTI with suprapubic cath in place.  Had treatment with chemo for CML and now is done, but has potentially pancreatitis, sepsis and recurrent UTI's complicating her history.  PMHx:  fatty liver, CML, L lung atelectasis, stranding pancreas, diverticulosis, uretural stents, HTN   Clinical Impression   PTA, pt was living with her husband and was independent; since surgery husband as been performing IADLs. Pt currently requiring Min guard A for LB ADLs and functional mobility with RW. Pt presenting with decreased strength and activity tolerance. Pt highly motivated to participate in therapy and aware of importance for be OOB and walking post surgery. Pt would benefit from further acute OT to facilitate safe dc. Recommend dc to home once medically stable per physician.    Follow Up Recommendations  No OT follow up;Supervision/Assistance - 24 hour    Equipment Recommendations  None recommended by OT    Recommendations for Other Services PT consult     Precautions / Restrictions Precautions Precautions: Fall Restrictions Weight Bearing Restrictions: No      Mobility Bed Mobility Overal bed mobility: Needs Assistance Bed Mobility: Supine to Sit     Supine to sit: Min guard     General bed mobility comments: Min Guard A for safety  Transfers Overall transfer level: Needs assistance Equipment used: Rolling walker (2 wheeled);1 person hand held assist Transfers: Sit to/from Stand Sit to Stand: Min guard         General transfer comment: Min Guard A for safety    Balance Overall balance assessment: Needs assistance Sitting-balance support: Bilateral upper extremity supported;Feet supported Sitting balance-Leahy Scale: Good      Standing balance support: Bilateral upper extremity supported;During functional activity Standing balance-Leahy Scale: Fair Standing balance comment: less than fair dynamic support                           ADL either performed or assessed with clinical judgement   ADL Overall ADL's : Needs assistance/impaired Eating/Feeding: Independent   Grooming: Min guard;Standing;Oral care Grooming Details (indicate cue type and reason): Min Guard A for safety  Upper Body Bathing: Supervision/ safety;Sitting   Lower Body Bathing: Min guard;Sit to/from stand   Upper Body Dressing : Supervision/safety;Standing Upper Body Dressing Details (indicate cue type and reason): donning house coat Lower Body Dressing: Min guard;Sit to/from stand Lower Body Dressing Details (indicate cue type and reason): donning slippers Toilet Transfer: Min guard;Ambulation;RW(simulated to recliner)           Functional mobility during ADLs: Min guard;Rolling walker General ADL Comments: Pt performing at [facility] A level with presented weakness post surgery. Pt highly motivated to participate in therapy.     Vision         Perception     Praxis      Pertinent Vitals/Pain Faces Pain Scale: Hurts a little bit Pain Location: surgical site Pain Descriptors / Indicators: Operative site guarding     Hand Dominance Right   Extremity/Trunk Assessment Upper Extremity Assessment Upper Extremity Assessment: Overall WFL for tasks assessed   Lower Extremity Assessment Lower Extremity Assessment: Generalized weakness   Cervical / Trunk Assessment Cervical / Trunk Assessment: Normal   Communication Communication Communication: No difficulties   Cognition Arousal/Alertness: Awake/alert Behavior During  Therapy: WFL for tasks assessed/performed Overall Cognitive Status: Within Functional Limits for tasks assessed                                 General Comments: Very agreeable to  motivated to participate in therapy   General Comments  Husband present throughout session and very supportive and concerned for pt    Exercises     Shoulder Instructions      Home Living Family/patient expects to be discharged to:: Private residence Living Arrangements: Spouse/significant other Available Help at Discharge: Family Type of Home: House Home Access: Level entry     Home Layout: One level     Bathroom Shower/Tub: Occupational psychologist: Standard     Home Equipment: Environmental consultant - 2 wheels;Cane - single point;Shower seat;Grab bars - tub/shower          Prior Functioning/Environment Level of Independence: Independent with assistive device(s)        Comments: Performing ADLs and IADLs prior to surgery on 10/11. Pt using rollator. Husband perform IADLs since surgery        OT Problem List: Decreased strength;Decreased range of motion;Decreased activity tolerance;Impaired balance (sitting and/or standing);Decreased safety awareness;Decreased knowledge of use of DME or AE;Decreased knowledge of precautions;Impaired UE functional use;Pain      OT Treatment/Interventions: Self-care/ADL training;Therapeutic exercise;Energy conservation;DME and/or AE instruction;Therapeutic activities;Patient/family education    OT Goals(Current goals can be found in the care plan section) Acute Rehab OT Goals Patient Stated Goal: To walk and get home with husband OT Goal Formulation: With patient Time For Goal Achievement: 12/05/17 Potential to Achieve Goals: Good  OT Frequency: Min 2X/week   Barriers to D/C:            Co-evaluation              AM-PAC PT "6 Clicks" Daily Activity     Outcome Measure Help from another person eating meals?: None Help from another person taking care of personal grooming?: A Little Help from another person toileting, which includes using toliet, bedpan, or urinal?: A Little Help from another person bathing (including washing,  rinsing, drying)?: A Little Help from another person to put on and taking off regular upper body clothing?: None Help from another person to put on and taking off regular lower body clothing?: A Little 6 Click Score: 20   End of Session Equipment Utilized During Treatment: Rolling walker Nurse Communication: Mobility status;Other (comment)(Needs to walk regularly)  Activity Tolerance: Patient tolerated treatment well Patient left: in chair;with call bell/phone within reach;with family/visitor present  OT Visit Diagnosis: Unsteadiness on feet (R26.81);Other abnormalities of gait and mobility (R26.89);Muscle weakness (generalized) (M62.81);Pain Pain - part of body: (Abdomen)                Time: 5188-4166 OT Time Calculation (min): 23 min Charges:  OT General Charges $OT Visit: 1 Visit OT Evaluation $OT Eval Low Complexity: 1 Low OT Treatments $Self Care/Home Management : 8-22 mins    MSOT, OTR/L Acute Rehab Pager: [phone] Office: [place] 11/21/2017, 5:03 PM

## 2017-11-21 NOTE — Care Management Note (Signed)
Case Management Note  Patient Details  Name: Summer Stephenson MRN: [id number] Date of Birth: 02-02-1932  Subjective/Objective:     Pt presents from home with husband for sepsis/UTI.  Pt moved from [place] in 8/19 to be near son and has had urological surgery in [place] since then.  Pt has not used [facility] services in the past and has RW, cane, and handicap- accessible 1 level condo.  Pt does not feel she needs DME.     Pt states she is not opposed to [facility] PT but does not feel that she needs it after working with PT yesterday.  Pt prefers to work with them again before deciding to arrange [facility] services.           Action/Plan: Will await further PT assessment and recommendations.    Expected Discharge Date:  11/22/17               Expected Discharge Plan:  [place]  In-House Referral:  NA  Discharge planning Services  CM Consult  Post Acute Care Choice:  Home Health Choice offered to:  Patient  DME Arranged:    DME Agency:     HH Arranged:    [place] Agency:     Status of Service:  In process, will continue to follow  If discussed at Long Length of Stay Meetings, dates discussed:    Additional Comments:  Claudie Leach, RN 11/21/2017, 10:42 AM

## 2017-11-21 NOTE — Plan of Care (Signed)

## 2017-11-21 NOTE — Clinical Social Work Note (Signed)
Pt refusing SNF. Clinical Social Worker will sign off for now as social work intervention is no longer needed. Please consult [place] again if new need arises.   Summer Stephenson A  11/21/2017

## 2017-11-22 DIAGNOSIS — R32 Unspecified urinary incontinence: Secondary | ICD-10-CM

## 2017-11-22 DIAGNOSIS — B9689 Other specified bacterial agents as the cause of diseases classified elsewhere: Secondary | ICD-10-CM

## 2017-11-22 DIAGNOSIS — B9620 Unspecified Escherichia coli [E. coli] as the cause of diseases classified elsewhere: Secondary | ICD-10-CM

## 2017-11-22 DIAGNOSIS — R7881 Bacteremia: Secondary | ICD-10-CM

## 2017-11-22 DIAGNOSIS — T83511A Infection and inflammatory reaction due to indwelling urethral catheter, initial encounter: Secondary | ICD-10-CM

## 2017-11-22 LAB — CULTURE, BLOOD (ROUTINE X 2): Special Requests: ADEQUATE

## 2017-11-22 LAB — CBC
HEMATOCRIT: 32.6 % — AB (ref 36.0–46.0)
HEMOGLOBIN: 9.8 g/dL — AB (ref 12.0–15.0)
MCH: 27.5 pg (ref 26.0–34.0)
MCHC: 30.1 g/dL (ref 30.0–36.0)
MCV: 91.3 fL (ref 80.0–100.0)
NRBC: 0 % (ref 0.0–0.2)
Platelets: 230 10*3/uL (ref 150–400)
RBC: 3.57 MIL/uL — ABNORMAL LOW (ref 3.87–5.11)
RDW: 13.2 % (ref 11.5–15.5)
WBC: 2.8 10*3/uL — AB (ref 4.0–10.5)

## 2017-11-22 LAB — BASIC METABOLIC PANEL
ANION GAP: 3 — AB (ref 5–15)
BUN: 7 mg/dL — AB (ref 8–23)
CHLORIDE: 110 mmol/L (ref 98–111)
CO2: 27 mmol/L (ref 22–32)
Calcium: 8.5 mg/dL — ABNORMAL LOW (ref 8.9–10.3)
Creatinine, Ser: 0.79 mg/dL (ref 0.44–1.00)
GFR calc non Af Amer: >60 mL/min (ref >60)
Glucose, Bld: 98 mg/dL (ref 70–99)
Potassium: 4 mmol/L (ref 3.5–5.1)
Sodium: 140 mmol/L (ref 135–145)

## 2017-11-22 LAB — GLUCOSE, CAPILLARY: GLUCOSE-CAPILLARY: 93 mg/dL (ref 70–99)

## 2017-11-22 LAB — URINE CULTURE: Culture: >=100000 — AB

## 2017-11-22 MED ORDER — CEFDINIR 300 MG PO CAPS: 300.0000 mg | ORAL_CAPSULE | Freq: Two times a day (BID) | ORAL | 0 refills | Status: AC

## 2017-11-22 NOTE — Progress Notes (Signed)
Pt given discharge instructions and gone over with her and husband, both verbalized understanding. All belongings gathered to be sent home.

## 2017-11-22 NOTE — Discharge Summary (Signed)
Name: Summer Stephenson MRN: [id number] DOB: 1932-12-17 84 y.o. PCP: System, Pcp Not In  Date of Admission: 11/19/2017  9:15 PM Date of Discharge: 11/22/2017 Attending Physician: Axel Filler, *  Discharge Diagnosis: 1. Complicated UTI  2. E. Coli Bacteremia 3. Urinary Incontinence  Discharge Medications: Allergies as of 11/22/2017   No Known Allergies     Medication List    TAKE these medications   acetaminophen 325 MG tablet Commonly known as:  TYLENOL Take 650 mg by mouth every 6 (six) hours as needed for fever.   cefdinir 300 MG capsule Commonly known as:  OMNICEF Take 1 capsule (300 mg total) by mouth 2 (two) times daily for 8 days.   denosumab 60 MG/ML Sosy injection Commonly known as:  PROLIA Inject 60 mg into the skin every 6 (six) months.   gabapentin 400 MG capsule Commonly known as:  NEURONTIN Take 400 mg by mouth 2 (two) times daily.   lisinopril 10 MG tablet Commonly known as:  PRINIVIL,ZESTRIL Take 10 mg by mouth daily.   metoprolol succinate 25 MG 24 hr tablet Commonly known as:  TOPROL-XL Take 25 mg by mouth at bedtime.            Durable Medical Equipment  (From admission, onward)         Start     Ordered   Unscheduled  For home use only DME [facility] Rile  [facility])  Once    Comments:  5 wheel walker  Question Answer Comment  Patient needs a walker to treat with the following condition Muscular deconditioning   Patient needs a walker to treat with the following condition Recent major surgery      11/22/17 1601          Disposition and follow-up:   [name] was discharged from [facility] in stable condition.  At the hospital follow up visit please address:  1.  Complicated UTI: UCx positive for E. Coli & Morganella. S/p vesico-vaginal fistula repair 10/11 with suprapubic catheter in place. Received 3 days ceftriaxone and d/c'd with 8 days Cefdinir. Assess suprapubic catheter site and completion of cefdinir  300 mg tablets. F/u with Dr. Lunette Stands 11/12.  E. Coli Bacteremia: symptoms of sepsis resolved prior to discharge.  Urinary Incontinence: Present for the past six months with recent surgeries. Assess for improvement s/p vesico-vaginal repair.   2.  Labs / imaging needed at time of follow-up: none  3.  Pending labs/ test needing follow-up: none  Follow-up Appointments:   Dr. Grant Ruts Urologist November 12th  [phone]  Hospital Course by problem list: Complicated UTI  Summer Stephenson is an 84yo female with PMH of CML (completed treatment five weeks ago), recurrent UTIs, urinary incontinence past 6 months, ureteral stents, multiple surgeries & mesh placement for bladder prolapse complicated by vesico-vaginal fistula, now s/p ex-lap for repair of fistula and mesh excision with suprapubic catheter palced during recent hospital stay 10/11 to 10/24 at [facility] who presented to [facility] ED with symptoms of sepsis. CT wnl except for some peripancreatic stranding, lipase was normal. She was found to be bacteremic with positive blood culture for E. Coli and positive urine culture for E. Coli and Morganella morgani. She was treated with vanc/cefepime and then switched to ceftriaxone once sensitivities were determined. Per discussion with her urologist, her suprapubic catheter should not be replaced until follow-up with her at [facility] on November 12th. Patient's symptoms of bacteremia resolved, and she was discharged with cefdinir for 8  days and home PT/OT as she has deconditioning due to recent surgery and prolonged recovery.   Discharge Vitals:   BP 137/74 (BP Location: Right Arm)   Pulse 70   Temp 99.2 F (37.3 C) (Axillary)   Resp 16   Ht 5\' 4"  (1.626 m)   Wt 65.8 kg   SpO2 96%   BMI 24.89 kg/m   Pertinent Labs, Studies, and Procedures:   CT Abdomen 11/02 1. Minimal stranding in the region of the uncinate process of the pancreas. Correlation with pancreatic enzymes recommended to  exclude pancreatitis. 2. Fatty liver. 3. Colonic diverticulosis without active inflammatory changes. No bowel obstruction or active inflammation. Normal appendix. 4. Bilateral pigtail ureteral stents with proximal tip in the region of the ureteropelvic junctions and distal end within the urinary bladder. No hydronephrosis on either side. 5. Left lung base subsegmental atelectasis or infiltrate.   By: Anner Crete M.D.   On: 11/19/2017 23:49  Urine culture: E. Coli and Morganella   Lipase: 20   Discharge Instructions: Discharge Instructions    Diet - low sodium heart healthy   Complete by:  As directed    Discharge instructions   Complete by:  As directed    You were hospitalized for Complicated Urinary Tract Infection and E. Coli Bacteremia. Thank you for allowing [place] to be part of your care.   Please note these changes made to your medications:   Please START taking:   Cefdinir (Omnicef) 300 mg capsule every 12 hours for 8 days.    Please make sure to follow-up with your urologist Dr. Grant Ruts on November 12th as planned.   Increase activity slowly   Complete by:  As directed       Signed: Molli Hazard A, DO 11/22/2017, 4:01 PM   Pager: [phone]

## 2017-11-22 NOTE — Progress Notes (Signed)
Internal Medicine Attending:   I saw and examined the patient. I reviewed the resident's note and I agree with the resident's findings and plan as documented in the resident's note.  Principal Problem:   E coli bacteremia Active Problems:   Complicated UTI (urinary tract infection)   Essential hypertension   Doing well today, e. Coli bacteremia seems well treated. Source of the infection is the suprapubic catheter which was placed only a few weeks ago. It seems this is a short term catheter designed to divert her urinary incontinence away from the healing surgical site at her vesicovaginal fistula. We will coordinate with her primary urologist regarding removal of the catheter, exchange, or leave the catheter and extend course of antibiotics until the catheter can be removed at [facility].   Lalla Brothers, MD

## 2017-11-22 NOTE — Progress Notes (Signed)
   Subjective:  She is feeling well this morning, no nausea or lower abdominal pain. No fever or chills overnight. She is open to home PT/OT and states her home is one level and well equipped for her condition as she works on regaining her strength.   Objective:  Vital signs in last 24 hours: Vitals:   11/21/17 0652 11/21/17 1428 11/21/17 1952 11/22/17 0500  BP: (!) 131/57 (!) 127/59 (!) 138/58 (!) 156/67  Pulse:  83 72 67  Resp:  18 16 16   Temp:  99.8 F (37.7 C) 98 F (36.7 C) 98.5 F (36.9 C)  TempSrc:  Oral Oral Oral  SpO2:  95% 96% 96%  Weight:      Height:       Physical:   Constitution: NAD, lying supine in bed HENT: AT/[place] Respiratory: non-labored breathing Abdominal: NTTP, soft MSK: moving all extremities, no pitting edema, warm extremities GU: suprapubic catheter with mild surrounding erythema and skin irritation, no purulent discharge or tenderness, urine yellow, purewick in place Neuro: a&o Skin: midline abdominal surgical scar c/d/i without erythema   Assessment/Plan:  Principal Problem:   E coli bacteremia Active Problems:   Complicated UTI (urinary tract infection)   Essential hypertension   Complicated UTI E. Coli Bacteremia No fever or chills overnight and she is feeling well. UCx growth of Morganella and E. Coli, which are sensitive to CTX. Organisms are also sensitive to cefdinir, and we will transition to this medication at discharge. Pending discussion with her urologist Dr. Grant Ruts and confirmation of appropriateness we will also switch out her suprapubic catheter which was placed in October. She has follow-up with Dr. Lunette Stands on November 12th. Otherwise she is stable for discharge.   - switch to p.o. Cefdinir for 7 days, will possibly extend until f/u with urologist if unable to exchange her suprapubic catheter today - home PT/OT to rebuild strength s/p surgery and hospitalization    Dispo: Anticipated discharge today.    Molli Hazard A, DO 11/22/2017, 7:36 AM Pager: [phone]

## 2017-11-22 NOTE — Consult Note (Signed)
Pharmacy students rounding with IMTP/herring service. Asked to evaluate appropriateness of antibiotic therapy. Have discussed with Jimmy Footman, PharmD/ID pharmacist. Have reviewed patient data with her. Given finding from culture and sensitivity report recommendation is for cefdinir 300 mg PO BID x 7 days.   [employer] and Russella Dar, [employer] Pharmacy Students

## 2017-11-24 LAB — CULTURE, BLOOD (ROUTINE X 2): CULTURE: NO GROWTH

## 2019-03-07 IMAGING — CT CT ABD-PELV W/ CM
2 of 5 series · 14 of 46 positions shown, 16 images · IV contrast (APPLIED)
Comparison: None.

CLINICAL DATA: 85-year-old female with acute abdominal pain. Fever
and nausea.

EXAM:
CT ABDOMEN AND PELVIS WITH CONTRAST
TECHNIQUE: Multidetector CT imaging of the abdomen and pelvis was performed
using the standard protocol following bolus administration of
intravenous contrast.
CONTRAST:  100mL OMNIPAQUE IOHEXOL 300 MG/ML  SOLN

[Series 3: abdomen 5.0 · axial · 0.78mm/px · z∈[-454,-64]mm · 11 of 94 slices shown, 13 images]
[im 8/94  soft-tissue]
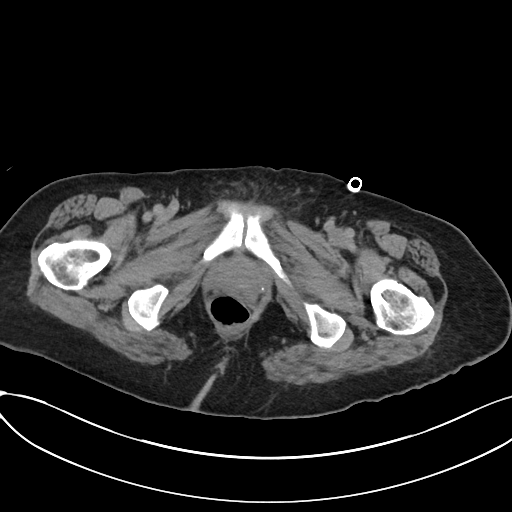
[im 8/94  bone]
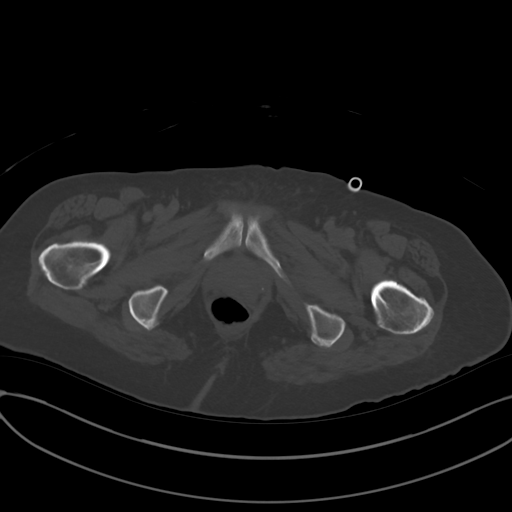
[im 16/94  soft-tissue]
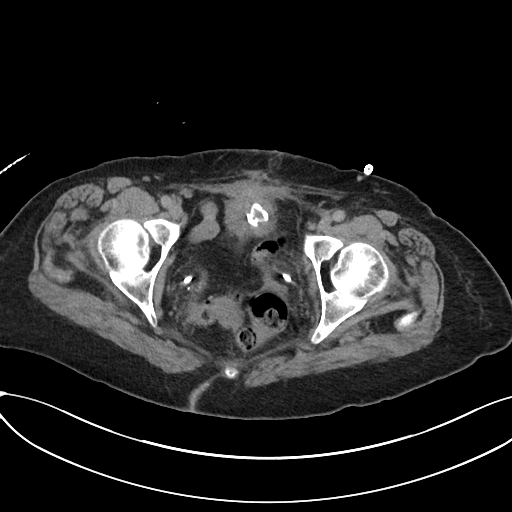
[im 24/94  soft-tissue]
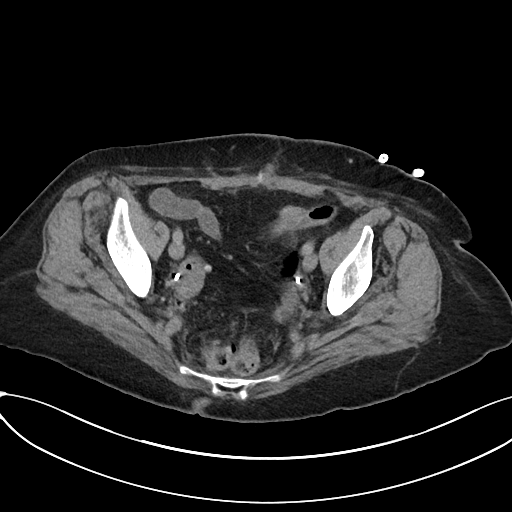
[im 32/94  soft-tissue]
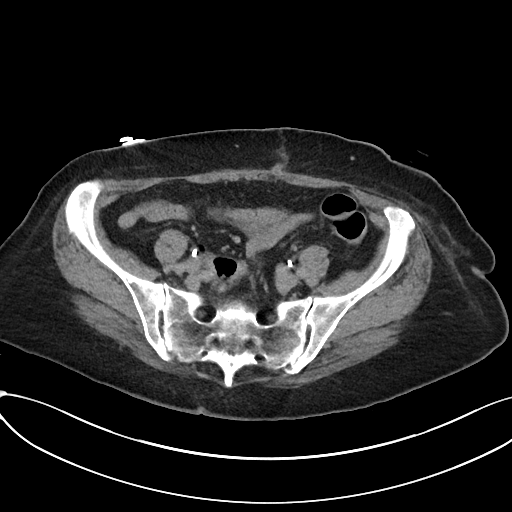
[im 39/94  soft-tissue]
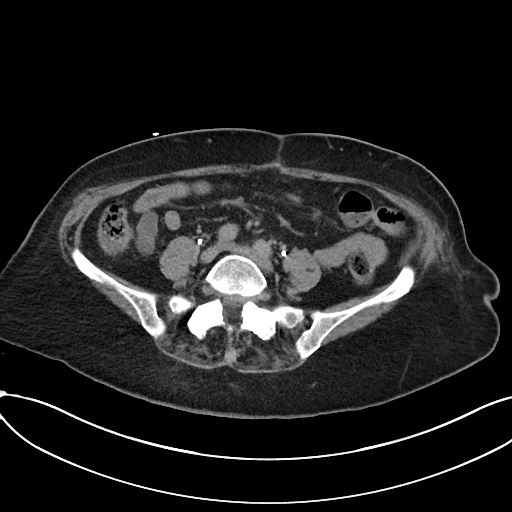
[im 47/94  soft-tissue]
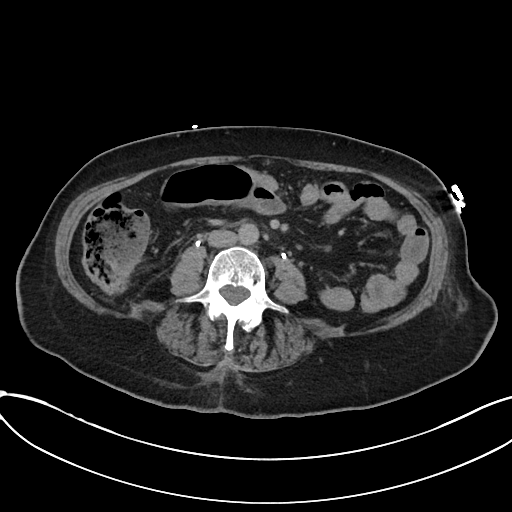
[im 55/94  soft-tissue]
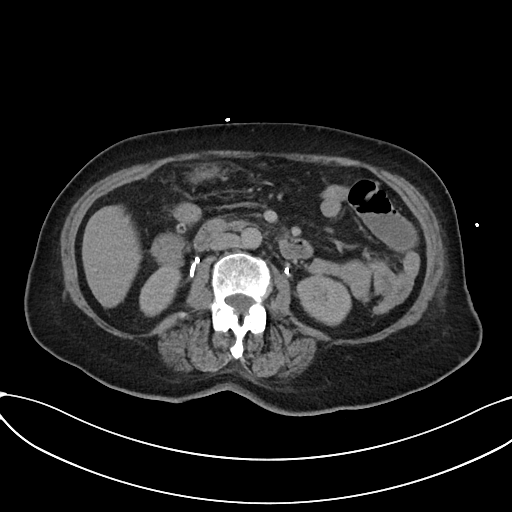
[im 63/94  soft-tissue]
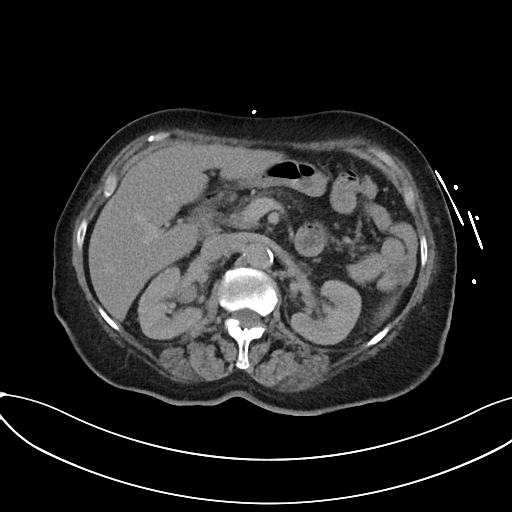
[im 70/94  soft-tissue]
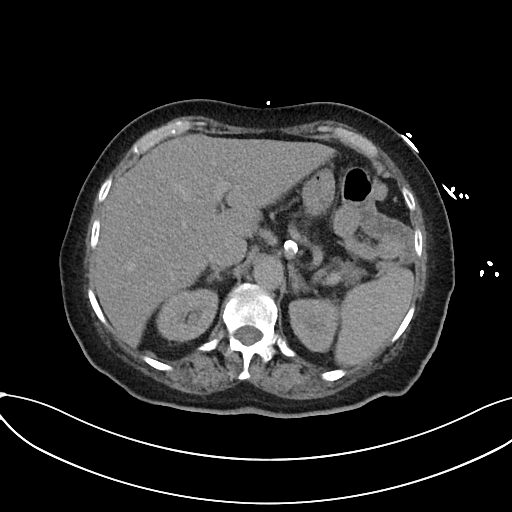
[im 70/94  bone]
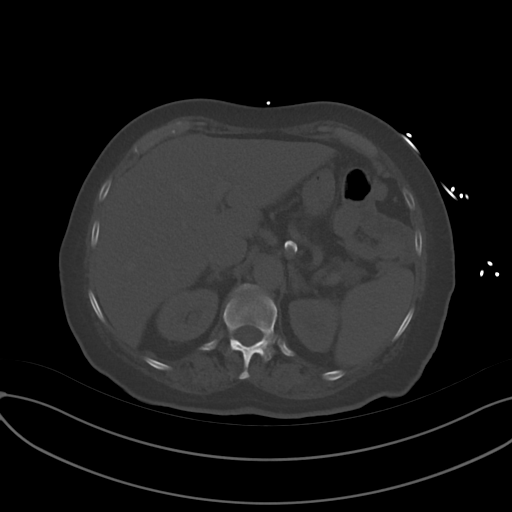
[im 78/94  soft-tissue]
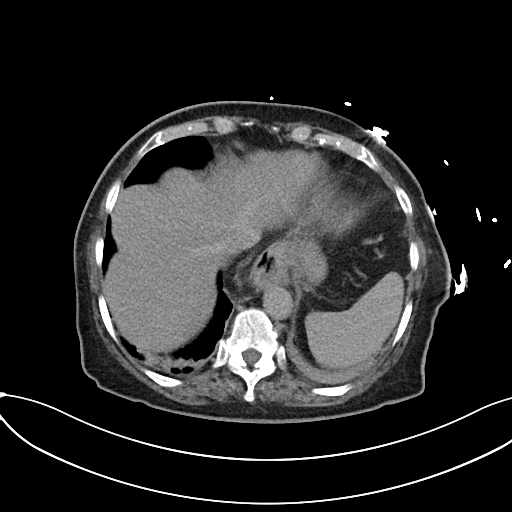
[im 86/94  soft-tissue]
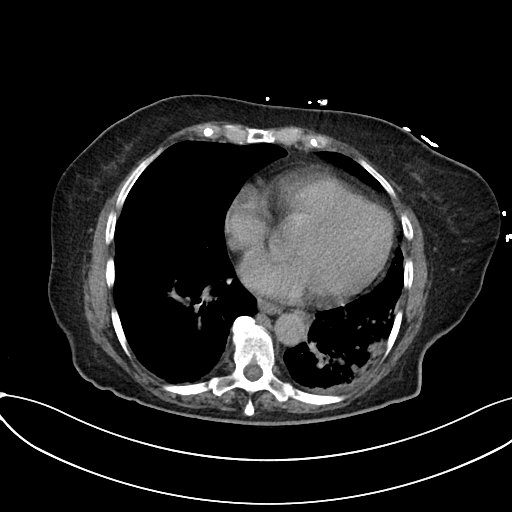

[Series 6: abdomen 3.0 mpr cor · coronal · 0.66mm/px · 3 of 75 slices shown]
[im 25/75  soft-tissue]
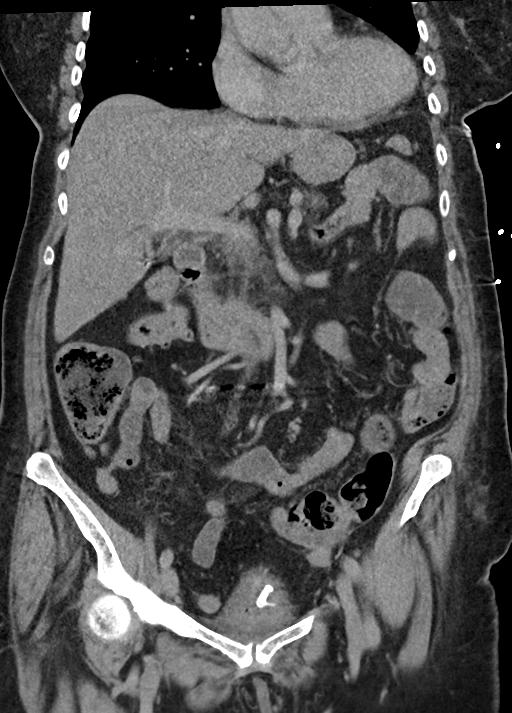
[im 33/75  soft-tissue]
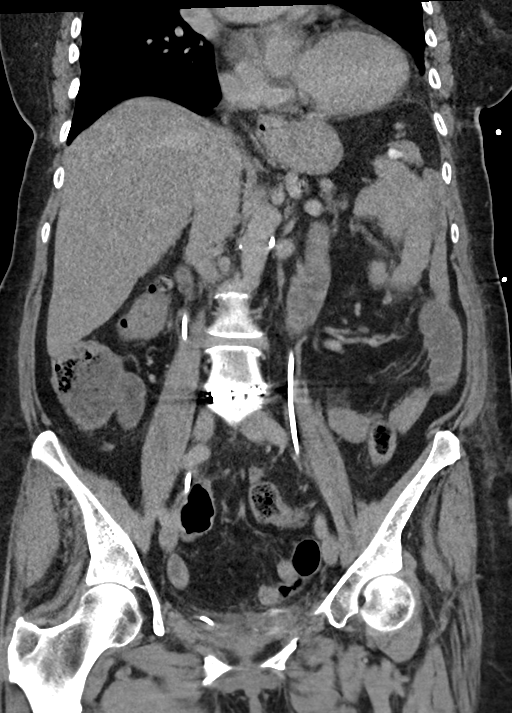
[im 42/75  soft-tissue]
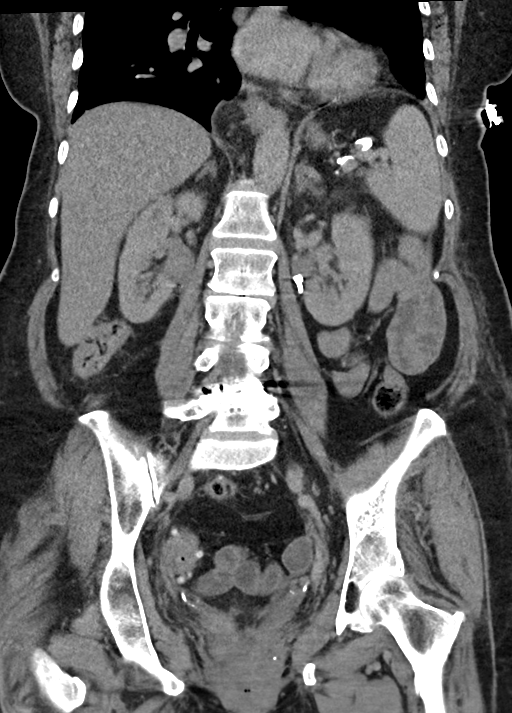

[14 of 46 positions shown; findings below may reference images not displayed]

FINDINGS: Evaluation of this exam is limited in the absence of intravenous
contrast.

Lower chest: A patchy area of subsegmental consolidation at the left
lung base may represent atelectasis or infiltrate. There are
otherwise bibasilar linear atelectasis/scarring.

No intra-abdominal free air or free fluid.

Hepatobiliary: Diffuse fatty infiltration of the liver. No
intrahepatic biliary ductal dilatation. Cholecystectomy.

Pancreas: There is moderate fatty atrophy of the pancreas. Minimal
stranding may be present in the region of the uncinate process of
the pancreas. Correlation with pancreatic enzymes recommended to
exclude pancreatitis. No drainable fluid collection/abscess or
pseudocyst.

Spleen: Normal in size without focal abnormality.

Adrenals/Urinary Tract: The adrenal glands are unremarkable. There
is no hydronephrosis on either side. Subcentimeter and ill-defined
right renal hypodense lesions are too small to characterize. There
is symmetric enhancement and excretion of contrast by both kidneys.
Bilateral pigtail ureteral stents with proximal tip in the region of
the ureteropelvic junctions and distal end within the urinary
bladder. The urinary bladder is collapsed around a suprapubic
catheter. Diffuse thickened appearance of the bladder wall likely
partly related to underdistention and possibly secondary to chronic
infection. Clinical correlation is recommended.

Stomach/Bowel: There is a small hiatal hernia. There is colonic
diverticulosis without active inflammatory changes. There is no
bowel obstruction or active inflammation. Normal appendix.

Vascular/Lymphatic: Mild aortoiliac atherosclerotic disease. No
portal venous gas. There is no adenopathy.

Reproductive: Hysterectomy. No pelvic mass.

Other: Midline vertical anterior pelvic wall incisional scar and a
small fat containing umbilical hernia.

Musculoskeletal: L4-L5 disc spacer. No acute osseous pathology.
IMPRESSION: 1. Minimal stranding in the region of the uncinate process of the
pancreas. Correlation with pancreatic enzymes recommended to exclude
pancreatitis.
2. Fatty liver.
3. Colonic diverticulosis without active inflammatory changes. No
bowel obstruction or active inflammation. Normal appendix.
4. Bilateral pigtail ureteral stents with proximal tip in the region
of the ureteropelvic junctions and distal end within the urinary
bladder. No hydronephrosis on either side.
5. Left lung base subsegmental atelectasis or infiltrate.

## 2022-08-27 ENCOUNTER — Other Ambulatory Visit: Payer: Self-pay

## 2022-08-27 ENCOUNTER — Observation Stay (HOSPITAL_COMMUNITY)
Admission: EM | Admit: 2022-08-27 | Discharge: 2022-08-28 | Disposition: A | Discharge status: Home / Self Care | Payer: Medicare Other | Attending: Internal Medicine | Admitting: Internal Medicine

## 2022-08-27 ENCOUNTER — Encounter (HOSPITAL_COMMUNITY): Payer: Self-pay

## 2022-08-27 ENCOUNTER — Emergency Department (HOSPITAL_COMMUNITY): Payer: Medicare Other

## 2022-08-27 DIAGNOSIS — R519 Headache, unspecified: Secondary | ICD-10-CM | POA: Diagnosis present

## 2022-08-27 DIAGNOSIS — E785 Hyperlipidemia, unspecified: Secondary | ICD-10-CM

## 2022-08-27 DIAGNOSIS — Z79899 Other long term (current) drug therapy: Secondary | ICD-10-CM | POA: Insufficient documentation

## 2022-08-27 DIAGNOSIS — Z1152 Encounter for screening for COVID-19: Secondary | ICD-10-CM | POA: Diagnosis not present

## 2022-08-27 DIAGNOSIS — N8110 Cystocele, unspecified: Secondary | ICD-10-CM | POA: Diagnosis present

## 2022-08-27 DIAGNOSIS — R29810 Facial weakness: Secondary | ICD-10-CM | POA: Insufficient documentation

## 2022-08-27 DIAGNOSIS — G629 Polyneuropathy, unspecified: Secondary | ICD-10-CM | POA: Insufficient documentation

## 2022-08-27 DIAGNOSIS — I10 Essential (primary) hypertension: Secondary | ICD-10-CM | POA: Diagnosis not present

## 2022-08-27 DIAGNOSIS — C9210 Chronic myeloid leukemia, BCR/ABL-positive, not having achieved remission: Secondary | ICD-10-CM | POA: Diagnosis present

## 2022-08-27 DIAGNOSIS — G4733 Obstructive sleep apnea (adult) (pediatric): Secondary | ICD-10-CM

## 2022-08-27 DIAGNOSIS — R778 Other specified abnormalities of plasma proteins: Secondary | ICD-10-CM | POA: Diagnosis not present

## 2022-08-27 DIAGNOSIS — R7989 Other specified abnormal findings of blood chemistry: Secondary | ICD-10-CM

## 2022-08-27 HISTORY — DX: Essential (primary) hypertension: I10

## 2022-08-27 LAB — URINALYSIS, ROUTINE W REFLEX MICROSCOPIC
Bacteria, UA: NONE SEEN
Bilirubin Urine: NEGATIVE
Glucose, UA: NEGATIVE mg/dL
Ketones, ur: NEGATIVE mg/dL
Leukocytes,Ua: NEGATIVE
Nitrite: NEGATIVE
Protein, ur: NEGATIVE mg/dL
Specific Gravity, Urine: 1.004 — ABNORMAL LOW (ref 1.005–1.030)
pH: 6 (ref 5.0–8.0)

## 2022-08-27 LAB — CBC
HCT: 36 % (ref 36.0–46.0)
Hemoglobin: 12 g/dL (ref 12.0–15.0)
MCH: 30.5 pg (ref 26.0–34.0)
MCHC: 33.3 g/dL (ref 30.0–36.0)
MCV: 91.6 fL (ref 80.0–100.0)
Platelets: 200 10*3/uL (ref 150–400)
RBC: 3.93 MIL/uL (ref 3.87–5.11)
RDW: 13.4 % (ref 11.5–15.5)
WBC: 4.6 10*3/uL (ref 4.0–10.5)
nRBC: 0 % (ref 0.0–0.2)

## 2022-08-27 LAB — BASIC METABOLIC PANEL
Anion gap: 10 (ref 5–15)
BUN: 15 mg/dL (ref 8–23)
CO2: 21 mmol/L — ABNORMAL LOW (ref 22–32)
Calcium: 9.2 mg/dL (ref 8.9–10.3)
Chloride: 106 mmol/L (ref 98–111)
Creatinine, Ser: 0.87 mg/dL (ref 0.44–1.00)
GFR, Estimated: >60 mL/min (ref >60)
Glucose, Bld: 128 mg/dL — ABNORMAL HIGH (ref 70–99)
Potassium: 3.7 mmol/L (ref 3.5–5.1)
Sodium: 137 mmol/L (ref 135–145)

## 2022-08-27 LAB — SARS CORONAVIRUS 2 BY RT PCR: SARS Coronavirus 2 by RT PCR: NEGATIVE

## 2022-08-27 MED ORDER — SODIUM CHLORIDE 0.9 % IV SOLN: INTRAVENOUS | Status: DC

## 2022-08-27 MED ORDER — DIPHENHYDRAMINE HCL 50 MG/ML IJ SOLN
12.5000 mg | Freq: Once | INTRAMUSCULAR | Status: AC
  Administered 2022-08-27: 12.5 mg via INTRAVENOUS
  Filled 2022-08-27: qty 1

## 2022-08-27 MED ORDER — MORPHINE SULFATE (PF) 4 MG/ML IV SOLN
4.0000 mg | Freq: Once | INTRAVENOUS | Status: AC
  Administered 2022-08-27: 4 mg via INTRAVENOUS
  Filled 2022-08-27: qty 1

## 2022-08-27 MED ORDER — KETOROLAC TROMETHAMINE 30 MG/ML IJ SOLN
30.0000 mg | Freq: Once | INTRAMUSCULAR | Status: AC
  Administered 2022-08-27: 30 mg via INTRAVENOUS
  Filled 2022-08-27: qty 1

## 2022-08-27 MED ORDER — METOPROLOL TARTRATE 25 MG PO TABS
25.0000 mg | ORAL_TABLET | Freq: Once | ORAL | Status: AC
  Administered 2022-08-27: 25 mg via ORAL
  Filled 2022-08-27: qty 1

## 2022-08-27 MED ORDER — METOCLOPRAMIDE HCL 5 MG/ML IJ SOLN
10.0000 mg | Freq: Once | INTRAMUSCULAR | Status: AC
  Administered 2022-08-27: 10 mg via INTRAVENOUS
  Filled 2022-08-27: qty 2

## 2022-08-27 MED ORDER — ONDANSETRON HCL 4 MG/2ML IJ SOLN
4.0000 mg | Freq: Once | INTRAMUSCULAR | Status: AC
  Administered 2022-08-27: 4 mg via INTRAVENOUS
  Filled 2022-08-27: qty 2

## 2022-08-27 NOTE — ED Triage Notes (Signed)
Patient bib [facility] from home with complaints of a headache on and off for 3 days. Headache originates in the base of neck and radiates to the left side of the face and to the top of the head. Within the last 3 hours patient states there is numbness in the left side of the face also. EMS reports no deficits and equal strength bilaterally. Patient has some nausea but no vomiting. Given 4mg  zofran enroute.  On arrival patient is A&Ox4 and has 10/10 pain in her head.

## 2022-08-27 NOTE — ED Notes (Signed)
Patient transported to CT 

## 2022-08-27 NOTE — ED Provider Notes (Signed)
[place] EMERGENCY DEPARTMENT AT [facility] Provider Note   CSN: [id number] Arrival date & time: 08/27/22  2136     History  Chief Complaint  Patient presents with   Headache   Numbness         Summer Stephenson is a 89 y.o. female.  Pt is a 89 yo female with pmhx significant for htn, CML, ckd, hld, sleep apnea, and glaucoma.  Pt said she has had a headache for the past few days.  She feels like her whole head is throbbing.  It is unusual for her to have a headache.  She has some nausea, but no vomiting.  She has numbness to both sides of her face.  No other neuro sx.       Home Medications Prior to Admission medications   Medication Sig Start Date End Date Taking? Authorizing Provider  acetaminophen (TYLENOL) 325 MG tablet Take 650 mg by mouth every 6 (six) hours as needed for fever.    [provider]  denosumab (PROLIA) 60 MG/ML SOSY injection Inject 60 mg into the skin every 6 (six) months.    [provider]  gabapentin (NEURONTIN) 400 MG capsule Take 400 mg by mouth 2 (two) times daily. 10/20/17   [provider]  lisinopril (PRINIVIL,ZESTRIL) 10 MG tablet Take 10 mg by mouth daily. 09/24/17   [provider]  metoprolol succinate (TOPROL-XL) 25 MG 24 hr tablet Take 25 mg by mouth at bedtime. 10/20/17   [provider]      Allergies    Patient has no known allergies.    Review of Systems   Review of Systems  Gastrointestinal:  Positive for nausea.  Neurological:  Positive for headaches.    Physical Exam Updated Vital Signs BP (!) 169/72   Pulse 72   Temp 97.8 F (36.6 C)   Resp 14   Ht 5\' 4"  (1.626 m)   Wt 72.6 kg   SpO2 97%   BMI 27.46 kg/m  Physical Exam Vitals and nursing note reviewed.  Constitutional:      Appearance: She is well-developed.  HENT:     Head: Normocephalic and atraumatic.     Mouth/Throat:     Mouth: Mucous membranes are moist.     Pharynx: Oropharynx is clear.  Eyes:      Extraocular Movements: Extraocular movements intact.     Pupils: Pupils are equal, round, and reactive to light.  Cardiovascular:     Rate and Rhythm: Normal rate and regular rhythm.     Heart sounds: Normal heart sounds.  Pulmonary:     Effort: Pulmonary effort is normal.     Breath sounds: Normal breath sounds.  Abdominal:     General: Bowel sounds are normal.     Palpations: Abdomen is soft.  Musculoskeletal:        General: Normal range of motion.     Cervical back: Normal range of motion and neck supple.  Skin:    General: Skin is warm.     Capillary Refill: Capillary refill takes less than 2 seconds.  Neurological:     Mental Status: She is alert and oriented to person, place, and time.  Psychiatric:        Mood and Affect: Mood normal.        Speech: Speech normal.        Behavior: Behavior normal.     ED Results / Procedures / Treatments   Labs (all labs ordered are  listed, but only abnormal results are displayed) Labs Reviewed  BASIC METABOLIC PANEL - Abnormal; Notable for the following components:      Result Value   CO2 21 (*)    Glucose, Bld 128 (*)    All other components within normal limits  URINALYSIS, ROUTINE W REFLEX MICROSCOPIC - Abnormal; Notable for the following components:   Color, Urine STRAW (*)    Specific Gravity, Urine 1.004 (*)    Hgb urine dipstick SMALL (*)    All other components within normal limits  SARS CORONAVIRUS 2 BY RT PCR  CBC  SEDIMENTATION RATE    EKG EKG Interpretation Date/Time:  Thursday August 27 2022 21:42:48 EDT Ventricular Rate:  75 PR Interval:  186 QRS Duration:  97 QT Interval:  427 QTC Calculation: 477 R Axis:   36  Text Interpretation: Sinus rhythm Anterior infarct, old Artifact in lead(s) I II aVR aVL aVF V1 No significant change since last tracing Confirmed by Jacalyn Lefevre [phone]) on 08/27/2022 9:55:21 PM  Radiology CT HEAD WO CONTRAST  Result Date: 08/27/2022 CLINICAL DATA:  Headache EXAM: CT HEAD  WITHOUT CONTRAST TECHNIQUE: Contiguous axial images were obtained from the base of the skull through the vertex without intravenous contrast. RADIATION DOSE REDUCTION: This exam was performed according to the departmental dose-optimization program which includes automated exposure control, adjustment of the mA and/or kV according to patient size and/or use of iterative reconstruction technique. COMPARISON:  None Available. FINDINGS: Brain: There is no mass, hemorrhage or extra-axial collection. The size and configuration of the ventricles and extra-axial CSF spaces are normal. The brain parenchyma is normal, without acute or chronic infarction. Vascular: No abnormal hyperdensity of the major intracranial arteries or dural venous sinuses. No intracranial atherosclerosis. Skull: The visualized skull base, calvarium and extracranial soft tissues are normal. Sinuses/Orbits: No fluid levels or advanced mucosal thickening of the visualized paranasal sinuses. No mastoid or middle ear effusion. The orbits are normal. IMPRESSION: No acute intracranial abnormality. Electronically Signed   By: Deatra Robinson M.D.   On: 08/27/2022 22:54    Procedures Procedures    Medications Ordered in ED Medications  0.9 %  sodium chloride infusion ( Intravenous New Bag/Given 08/27/22 2156)  ketorolac (TORADOL) 30 MG/ML injection 30 mg (has no administration in time range)  metoCLOPramide (REGLAN) injection 10 mg (has no administration in time range)  diphenhydrAMINE (BENADRYL) injection 12.5 mg (has no administration in time range)  metoprolol tartrate (LOPRESSOR) tablet 25 mg (has no administration in time range)  ondansetron (ZOFRAN) injection 4 mg (4 mg Intravenous Given 08/27/22 2156)  morphine (PF) 4 MG/ML injection 4 mg (4 mg Intravenous Given 08/27/22 2156)    ED Course/ Medical Decision Making/ A&P                                 Medical Decision Making Amount and/or Complexity of Data Reviewed Labs:  ordered. Radiology: ordered.  Risk Prescription drug management.   This patient presents to the ED for concern of headache, this involves an extensive number of treatment options, and is a complaint that carries with it a high risk of complications and morbidity.  The differential diagnosis includes ich, migraine   Co morbidities that complicate the patient evaluation  htn, CML, ckd, hld, sleep apnea, and glaucoma   Additional history obtained:  Additional history obtained from epic chart review External records from outside source obtained and reviewed including EMS report  Lab Tests:  I Ordered, and personally interpreted labs.  The pertinent results include:  cbc nl, bmp nl, covid neg, ua nl   Imaging Studies ordered:  I ordered imaging studies including ct head  I independently visualized and interpreted imaging which showed  I agree with the radiologist interpretation   Cardiac Monitoring:  The patient was maintained on a cardiac monitor.  I personally viewed and interpreted the cardiac monitored which showed an underlying rhythm of: nsr   Medicines ordered and prescription drug management:  I ordered medication including ivfs/morphine/zofran  for sx  Reevaluation of the patient after these medicines showed that the patient improved I have reviewed the patients home medicines and have made adjustments as needed   Test Considered:  ct   Critical Interventions:  mri   Problem List / ED Course:  Headache:  pt is still having h/a after morphine.  I have ordered toradol, reglan, benadryl.  MRI added on.  Pt signed out to Dr. Manus Gunning at shift change. HTN:  pt has not taken her evening metoprolol, so that is given to pt.   Reevaluation:  After the interventions noted above, I reevaluated the patient and found that they have :improved   Social Determinants of Health:  Lives at home   Dispostion:  Pending at shift change.        Final  Clinical Impression(s) / ED Diagnoses Final diagnoses:  Acute nonintractable headache, unspecified headache type    Rx / DC Orders ED Discharge Orders     None         Jacalyn Lefevre, MD 08/27/22 2322

## 2022-08-28 DIAGNOSIS — R519 Headache, unspecified: Secondary | ICD-10-CM

## 2022-08-28 DIAGNOSIS — R7989 Other specified abnormal findings of blood chemistry: Secondary | ICD-10-CM

## 2022-08-28 DIAGNOSIS — G4733 Obstructive sleep apnea (adult) (pediatric): Secondary | ICD-10-CM

## 2022-08-28 DIAGNOSIS — E785 Hyperlipidemia, unspecified: Secondary | ICD-10-CM

## 2022-08-28 LAB — TROPONIN I (HIGH SENSITIVITY)
Troponin I (High Sensitivity): 21 ng/L — ABNORMAL HIGH (ref <18)
Troponin I (High Sensitivity): 50 ng/L — ABNORMAL HIGH (ref <18)

## 2022-08-28 MED ORDER — LISINOPRIL 20 MG PO TABS: 20.0000 mg | ORAL_TABLET | Freq: Every day | ORAL | 0 refills | Status: AC

## 2022-08-28 MED ORDER — IOHEXOL 350 MG/ML SOLN
75.0000 mL | Freq: Once | INTRAVENOUS | Status: AC | PRN
  Administered 2022-08-28: 75 mL via INTRAVENOUS

## 2022-08-28 MED ORDER — METOPROLOL SUCCINATE ER 25 MG PO TB24: 25.0000 mg | ORAL_TABLET | Freq: Every day | ORAL | Status: DC

## 2022-08-28 MED ORDER — SENNOSIDES-DOCUSATE SODIUM 8.6-50 MG PO TABS: 1.0000 | ORAL_TABLET | Freq: Every evening | ORAL | Status: DC | PRN

## 2022-08-28 MED ORDER — FLUORESCEIN SODIUM 1 MG OP STRP
1.0000 | ORAL_STRIP | Freq: Once | OPHTHALMIC | Status: DC
  Filled 2022-08-28: qty 1

## 2022-08-28 MED ORDER — HYDRALAZINE HCL 20 MG/ML IJ SOLN: 5.0000 mg | INTRAMUSCULAR | Status: DC | PRN

## 2022-08-28 MED ORDER — ACETAMINOPHEN 325 MG PO TABS: 650.0000 mg | ORAL_TABLET | Freq: Four times a day (QID) | ORAL | Status: DC | PRN

## 2022-08-28 MED ORDER — ENOXAPARIN SODIUM 40 MG/0.4ML IJ SOSY: 40.0000 mg | PREFILLED_SYRINGE | Freq: Every day | INTRAMUSCULAR | Status: DC

## 2022-08-28 MED ORDER — VALPROATE SODIUM 100 MG/ML IV SOLN
500.0000 mg | Freq: Once | INTRAVENOUS | Status: DC
  Filled 2022-08-28: qty 5

## 2022-08-28 MED ORDER — LISINOPRIL 20 MG PO TABS: 20.0000 mg | ORAL_TABLET | Freq: Every day | ORAL | Status: DC

## 2022-08-28 MED ORDER — GABAPENTIN 400 MG PO CAPS: 400.0000 mg | ORAL_CAPSULE | Freq: Two times a day (BID) | ORAL | Status: DC

## 2022-08-28 MED ORDER — TETRACAINE HCL 0.5 % OP SOLN
2.0000 [drp] | Freq: Once | OPHTHALMIC | Status: DC
  Filled 2022-08-28: qty 4

## 2022-08-28 MED ORDER — GABAPENTIN 400 MG PO CAPS: 400.0000 mg | ORAL_CAPSULE | Freq: Every day | ORAL | Status: AC

## 2022-08-28 MED ORDER — ACETAMINOPHEN 650 MG RE SUPP: 650.0000 mg | Freq: Four times a day (QID) | RECTAL | Status: DC | PRN

## 2022-08-28 MED ORDER — ONDANSETRON HCL 4 MG PO TABS: 4.0000 mg | ORAL_TABLET | Freq: Four times a day (QID) | ORAL | Status: DC | PRN

## 2022-08-28 MED ORDER — DEXAMETHASONE SODIUM PHOSPHATE 10 MG/ML IJ SOLN
10.0000 mg | Freq: Once | INTRAMUSCULAR | Status: AC
  Administered 2022-08-28: 10 mg via INTRAVENOUS
  Filled 2022-08-28: qty 1

## 2022-08-28 MED ORDER — LISINOPRIL 10 MG PO TABS
10.0000 mg | ORAL_TABLET | Freq: Every day | ORAL | Status: DC
  Administered 2022-08-28: 10 mg via ORAL
  Filled 2022-08-28: qty 1

## 2022-08-28 MED ORDER — ONDANSETRON HCL 4 MG/2ML IJ SOLN: 4.0000 mg | Freq: Four times a day (QID) | INTRAMUSCULAR | Status: DC | PRN

## 2022-08-28 MED ORDER — DASATINIB 50 MG PO TABS: 50.0000 mg | ORAL_TABLET | Freq: Every day | ORAL | Status: DC

## 2022-08-28 NOTE — ED Notes (Signed)
Pt ambulated to the restroom with family members cane. Pt stated that she felt a little wobbly but otherwise ok.

## 2022-08-28 NOTE — Progress Notes (Signed)
Patient admitted from [facility] ED to [address] accompanied by RN alert and oriented. Attached to continuous cardiac monitoring and CCMD notified.V/S checked,call bell within reach.

## 2022-08-28 NOTE — ED Provider Notes (Signed)
Care assumed from Dr. Particia Nearing.  Patient here with diffuse headache onset 2 days ago with nausea and numbness to her entire face.  CT head is negative.  Pending MRI.  On recheck, patient states headache is not improved.  MRI is negative for any acute findings or acute stroke.  CTA is negative for aneurysm.  She has no temporal artery tenderness and ESR is not elevated.  20/25 bilateral via visual acuity screening.   Intraocular pressure is 5 bilaterally. Patient states history of "pre glaucoma" but doesn't take any eye drops and has never had elevated eye pressures.   Headache has improved.  Troponin has risen from 21-50.  She denies any chest pain but did have some nausea and shortness of breath earlier.  Normal echocardiogram 1 week ago. This is available in care everywhere.  Unclear etiology of rising troponins in the setting of nausea.  Observation admission discussed with Dr. Joneen Roach.    Summer Octave, MD 08/28/22 [phone]

## 2022-08-28 NOTE — H&P (Signed)
PCP:   Pcp, No   Chief Complaint:  Headache  HPI: This is a 89 year old female with past medical history of HTN, HLD, OSA, CML, bladder prolapse.  She presents with complaints of headache.  Per patient for the last week she has had a generalized headache with pressure and throbbing that comes and goes.  Last night the same recurred however this time she had numbness on the sides of her face.  She additionally had tingling on the face and back of her neck.  She denied double vision or blurred vision.  She denies any other localized stroke type symptoms.  She denies history of migraines.  She endorses some mild dizzy spells.  She last saw her ophthalmologist 6 months ago.  Her next appointment is on Monday.  She took her blood pressure and her systolic blood pressure was 180.  She is typically normotensive in the 120s.  She denies chest pains or shortness of breath.  She denies increased fatigability with activity.  She does most of her ADLs.  She does endorse some increased lower extremity swelling notably within the last 4 to 6 weeks.  This is associated with peripheral neuropathy pain.  Patient recently started Lyrica but discontinued it as it made her feel little bit more somnolent and a bit dizzy.  She states she took it for only 3 days and stopped it approximately a week ago.  In the ED patient presenting blood pressure was 169/72.  Otherwise vital stable.  She had an extensive stroke workup including CT head, CTA head and neck, MRI brain.  All without significant finding.  No acute ischemic changes.  Troponins were collected, her initial troponin was 21, follow-up troponin 50.  Patient denies chest pains.  However based on the change, EDP requested overnight observation.    Of note patient had a 2D echo done 08/20/22.  Findings normal LV size, wall thickness, wall motion and systolic function with EF 60 to 65%  Review of Systems:  Per HPI  Past Medical History: Past Medical History:  Diagnosis  Date   Chronic Leukemia    HTN (hypertension)    History reviewed. No pertinent surgical history.  Medications: Prior to Admission medications   Medication Sig Start Date End Date Taking? Authorizing Provider  acetaminophen (TYLENOL) 325 MG tablet Take 650 mg by mouth every 6 (six) hours as needed for fever.    [provider]  denosumab (PROLIA) 60 MG/ML SOSY injection Inject 60 mg into the skin every 6 (six) months.    [provider]  gabapentin (NEURONTIN) 400 MG capsule Take 400 mg by mouth 2 (two) times daily. 10/20/17   [provider]  lisinopril (PRINIVIL,ZESTRIL) 10 MG tablet Take 10 mg by mouth daily. 09/24/17   [provider]  metoprolol succinate (TOPROL-XL) 25 MG 24 hr tablet Take 25 mg by mouth at bedtime. 10/20/17   [provider]    Allergies:  No Known Allergies  Social History: Patient has no history of tobacco use, alcohol use, and drug use.  Family History: HTN  Physical Exam: Vitals:   08/27/22 2145 08/27/22 2146 08/27/22 2330 08/28/22 0230  BP: (!) 169/72  (!) 161/65 (!) 148/64  Pulse: 72  69 (!) 56  Resp: 14  18 18   Temp: 97.8 F (36.6 C)     SpO2: 97%  97% 95%  Weight:  72.6 kg    Height:  5\' 4"  (1.626 m)      General: A and O  x 3, well developed and nourished, no acute distress Eyes: PERRLA, pink conjunctiva, no scleral icterus ENT: Moist oral mucosa, neck supple, no thyromegaly Lungs: clear to ascultation, no wheeze or use of accessory muscles Cardiovascular: RRR, no reducible chest wall pain. No carotid bruits or JVD Abdomen: soft, positive BS, non-tender, NTND not an acute abdomen GU: not examined Neuro: CN II - XII grossly intact, sensation intact Musculoskeletal: strength 5/5 all extremities.  No lower extremity edema Skin: no rash, no decubitus Psych: appropriate patient   Labs on Admission:  Recent Labs    08/27/22 2149  NA 137  K 3.7  CL 106  CO2 21*  GLUCOSE 128*  BUN 15   CREATININE 0.87  CALCIUM 9.2    Recent Labs    08/27/22 2149  WBC 4.6  HGB 12.0  HCT 36.0  MCV 91.6  PLT 200    Micro Results: Recent Results (from the past 240 hour(s))  SARS Coronavirus 2 by RT PCR (hospital order, performed in [facility] hospital lab) *cepheid single result test* Anterior Nasal Swab     Status: None   Collection Time: 08/27/22  9:49 PM   Specimen: Anterior Nasal Swab  Result Value Ref Range Status   SARS Coronavirus 2 by RT PCR NEGATIVE NEGATIVE Final    Comment: Performed at [facility], [address], [place], [place] [address]     Radiological Exams on Admission: MR BRAIN WO CONTRAST  Result Date: 08/28/2022 CLINICAL DATA:  Headache.  Acute neurologic deficit. EXAM: MRI HEAD WITHOUT CONTRAST TECHNIQUE: Multiplanar, multiecho pulse sequences of the brain and surrounding structures were obtained without intravenous contrast. COMPARISON:  None Available. FINDINGS: Brain: No acute infarct, mass effect or extra-axial collection. No acute or chronic hemorrhage. There is multifocal hyperintense T2-weighted signal within the white matter. Parenchymal volume and CSF spaces are normal. The midline structures are normal. Vascular: Major flow voids are preserved. Skull and upper cervical spine: Normal calvarium and skull base. Visualized upper cervical spine and soft tissues are normal. Sinuses/Orbits:No paranasal sinus fluid levels or advanced mucosal thickening. No mastoid or middle ear effusion. Normal orbits. IMPRESSION: 1. No acute intracranial abnormality. 2. Chronic small vessel ischemia. Electronically Signed   By: Deatra Robinson M.D.   On: 08/28/2022 00:37   CT ANGIO HEAD NECK W WO CM  Result Date: 08/28/2022 CLINICAL DATA:  Headache EXAM: CT ANGIOGRAPHY HEAD AND NECK WITH AND WITHOUT CONTRAST TECHNIQUE: Multidetector CT imaging of the head and neck was performed using the standard protocol during bolus administration of intravenous contrast. Multiplanar CT  image reconstructions and MIPs were obtained to evaluate the vascular anatomy. Carotid stenosis measurements (when applicable) are obtained utilizing NASCET criteria, using the distal internal carotid diameter as the denominator. RADIATION DOSE REDUCTION: This exam was performed according to the departmental dose-optimization program which includes automated exposure control, adjustment of the mA and/or kV according to patient size and/or use of iterative reconstruction technique. CONTRAST:  75mL OMNIPAQUE IOHEXOL 350 MG/ML SOLN COMPARISON:  None Available. FINDINGS: CTA NECK FINDINGS SKELETON: There is no bony spinal canal stenosis. No lytic or blastic lesion. OTHER NECK: Normal pharynx, larynx and major salivary glands. No cervical lymphadenopathy. Unremarkable thyroid gland. UPPER CHEST: No pneumothorax or pleural effusion. No nodules or masses. AORTIC ARCH: There is calcific atherosclerosis of the aortic arch. Conventional 3 vessel aortic branching pattern. RIGHT CAROTID SYSTEM: No dissection, occlusion or aneurysm. Mild atherosclerotic calcification at the carotid bifurcation without hemodynamically significant stenosis. LEFT CAROTID SYSTEM: No  dissection, occlusion or aneurysm. Mild atherosclerotic calcification at the carotid bifurcation without hemodynamically significant stenosis. VERTEBRAL ARTERIES: Right dominant configuration.There is no dissection, occlusion or flow-limiting stenosis to the skull base (V1-V3 segments). CTA HEAD FINDINGS POSTERIOR CIRCULATION: --Vertebral arteries: Normal V4 segments. --Inferior cerebellar arteries: Normal. --Basilar artery: Normal. --Superior cerebellar arteries: Normal. --Posterior cerebral arteries (PCA): Normal. ANTERIOR CIRCULATION: --Intracranial internal carotid arteries: Normal. --Anterior cerebral arteries (ACA): Normal. Both A1 segments are present. Patent anterior communicating artery (a-comm). --Middle cerebral arteries (MCA): Normal. VENOUS SINUSES: As  permitted by contrast timing, patent. ANATOMIC VARIANTS: None Review of the MIP images confirms the above findings. IMPRESSION: 1. No emergent large vessel occlusion or hemodynamically significant stenosis of the head or neck. 2. Mild bilateral carotid bifurcation atherosclerosis without hemodynamically significant stenosis. Aortic Atherosclerosis (ICD10-I70.0). Electronically Signed   By: Deatra Robinson M.D.   On: 08/28/2022 00:06   CT HEAD WO CONTRAST  Result Date: 08/27/2022 CLINICAL DATA:  Headache EXAM: CT HEAD WITHOUT CONTRAST TECHNIQUE: Contiguous axial images were obtained from the base of the skull through the vertex without intravenous contrast. RADIATION DOSE REDUCTION: This exam was performed according to the departmental dose-optimization program which includes automated exposure control, adjustment of the mA and/or kV according to patient size and/or use of iterative reconstruction technique. COMPARISON:  None Available. FINDINGS: Brain: There is no mass, hemorrhage or extra-axial collection. The size and configuration of the ventricles and extra-axial CSF spaces are normal. The brain parenchyma is normal, without acute or chronic infarction. Vascular: No abnormal hyperdensity of the major intracranial arteries or dural venous sinuses. No intracranial atherosclerosis. Skull: The visualized skull base, calvarium and extracranial soft tissues are normal. Sinuses/Orbits: No fluid levels or advanced mucosal thickening of the visualized paranasal sinuses. No mastoid or middle ear effusion. The orbits are normal. IMPRESSION: No acute intracranial abnormality. Electronically Signed   By: Deatra Robinson M.D.   On: 08/27/2022 22:54    2D echo done at [facility] on/1/24 SUMMARY  Normal LV size, wall thickness, wall motion and systolic function with ejection fraction 60-65%  There is aortic valve sclerosis.  There is no aortic stenosis.  There is trace aortic regurgitation.  There is no comparison study  available.   Assessment/Plan Present on Admission:  Elevated troponin -Patient denies chest pains.  Troponin increased from 20 to 51.  Will continue trending troponins -EKG without ischemic changes -Patient's cardiac risk factors includes HTN, HLD.  Patient compliant with medication -Recent echo done 08/20/2022 without any different abnormality.  Will not repeat unless the troponin comes back more elevated   Headache -Improved.  Treated with Decadron, Benadryl, Toradol, Reglan in the ER -Monitor   Essential hypertension -Lisinopril and metoprolol resumed.  First dose of lisinopril now.   -Hydralazine as needed.   Peripheral neuropathy -Neurontin resumed   CML (chronic myelocytic leukemia) (HCC) -Patient on Prolia every 6 months and Sprycel daily.  Sprycel resumed  ,  08/28/2022, 4:25 AM

## 2022-08-28 NOTE — ED Notes (Signed)
20/25 bilateral via visual acuity screening.

## 2022-09-04 NOTE — Discharge Summary (Signed)
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  She presents with complaints of headache.  Per patient for the last week she has had a generalized headache with pressure and throbbing that comes and goes.  Last night the same recurred however this time she had numbness on the sides of her face.  She additionally had tingling on the face and back of her neck   Hospital Course:   Headache Left facial numbness -Resolved with supportive care, CT and MRI head were unremarkable -Suspect secondary to recent medication changes, she was abruptly taken off gabapentin and started on Lyrica which she stopped a few days later, suspect some component of withdrawal contributing to her headache, in addition her chemotherapy agent for CML could also be contributing -This morning she has improved, no focal symptoms -Restarted on gabapentin at lower dose -Follow-up with PCP in 1 week  Elevated troponin -Patient denies chest pains.  Unclear why troponin was checked in the emergency room, it was 20 and 50, EKG without any EKG changes  -Recent echo done 08/20/2022 without any different abnormality.  -No further workup was pursued in the absence of symptoms and relatively benign workup    Essential  hypertension -Lisinopril and metoprolol resumed.  First dose of lisinopril now.   -Hydralazine as needed.    Peripheral neuropathy -Neurontin resumed    CML (chronic myelocytic leukemia) (HCC) -Patient on Prolia every 6 months and Sprycel daily.  Sprycel resumed    Discharge Exam: Vitals:   08/28/22 0527 08/28/22 0612  BP: (!) 161/65 (!) 167/66  Pulse: 63 64  Resp: 18 (!) 24  Temp:  97.8 F (36.6 C)  SpO2: 96% 95%   Gen: Awake, Alert, Oriented X 3,  HEENT: no JVD Lungs: Good air movement bilaterally, CTAB CVS: S1S2/RRR Abd: soft, Non tender, non distended, BS present Extremities: No edema Skin: no new rashes on exposed skin   Discharge Instructions   Discharge Instructions     Diet - low sodium heart healthy   Complete by: As directed    Increase activity slowly   Complete by: As directed       Allergies as of 08/28/2022   No Known Allergies      Medication List     TAKE these medications    acetaminophen 325 MG tablet Commonly known as: TYLENOL Take 650 mg by mouth every 6 (six) hours as needed for fever.   denosumab 60 MG/ML Sosy injection Commonly known as: PROLIA Inject 60 mg into the skin every 6 (six) months.   gabapentin 400 MG capsule Commonly known as: NEURONTIN Take 1 capsule (400 mg total) by mouth at bedtime. What changed: when to take this   lisinopril 20 MG tablet Commonly known as: ZESTRIL Take 1 tablet (20 mg total) by mouth  daily. What changed:  medication strength how much to take   metoprolol succinate 25 MG 24 hr tablet Commonly known as: TOPROL-XL Take 25 mg by mouth at bedtime.       No Known Allergies    The results of significant diagnostics from this hospitalization (including imaging, microbiology, ancillary and laboratory) are listed below for reference.    Significant Diagnostic Studies: MR BRAIN WO CONTRAST  Result Date: 08/28/2022 CLINICAL DATA:  Headache.  Acute neurologic deficit. EXAM: MRI HEAD  WITHOUT CONTRAST TECHNIQUE: Multiplanar, multiecho pulse sequences of the brain and surrounding structures were obtained without intravenous contrast. COMPARISON:  None Available. FINDINGS: Brain: No acute infarct, mass effect or extra-axial collection. No acute or chronic hemorrhage. There is multifocal hyperintense T2-weighted signal within the white matter. Parenchymal volume and CSF spaces are normal. The midline structures are normal. Vascular: Major flow voids are preserved. Skull and upper cervical spine: Normal calvarium and skull base. Visualized upper cervical spine and soft tissues are normal. Sinuses/Orbits:No paranasal sinus fluid levels or advanced mucosal thickening. No mastoid or middle ear effusion. Normal orbits. IMPRESSION: 1. No acute intracranial abnormality. 2. Chronic small vessel ischemia. Electronically Signed   By: Deatra Robinson M.D.   On: 08/28/2022 00:37   CT ANGIO HEAD NECK W WO CM  Result Date: 08/28/2022 CLINICAL DATA:  Headache EXAM: CT ANGIOGRAPHY HEAD AND NECK WITH AND WITHOUT CONTRAST TECHNIQUE: Multidetector CT imaging of the head and neck was performed using the standard protocol during bolus administration of intravenous contrast. Multiplanar CT image reconstructions and MIPs were obtained to evaluate the vascular anatomy. Carotid stenosis measurements (when applicable) are obtained utilizing NASCET criteria, using the distal internal carotid diameter as the denominator. RADIATION DOSE REDUCTION: This exam was performed according to the departmental dose-optimization program which includes automated exposure control, adjustment of the mA and/or kV according to patient size and/or use of iterative reconstruction technique. CONTRAST:  75mL OMNIPAQUE IOHEXOL 350 MG/ML SOLN COMPARISON:  None Available. FINDINGS: CTA NECK FINDINGS SKELETON: There is no bony spinal canal stenosis. No lytic or blastic lesion. OTHER NECK: Normal pharynx, larynx and major salivary glands. No cervical  lymphadenopathy. Unremarkable thyroid gland. UPPER CHEST: No pneumothorax or pleural effusion. No nodules or masses. AORTIC ARCH: There is calcific atherosclerosis of the aortic arch. Conventional 3 vessel aortic branching pattern. RIGHT CAROTID SYSTEM: No dissection, occlusion or aneurysm. Mild atherosclerotic calcification at the carotid bifurcation without hemodynamically significant stenosis. LEFT CAROTID SYSTEM: No dissection, occlusion or aneurysm. Mild atherosclerotic calcification at the carotid bifurcation without hemodynamically significant stenosis. VERTEBRAL ARTERIES: Right dominant configuration.There is no dissection, occlusion or flow-limiting stenosis to the skull base (V1-V3 segments). CTA HEAD FINDINGS POSTERIOR CIRCULATION: --Vertebral arteries: Normal V4 segments. --Inferior cerebellar arteries: Normal. --Basilar artery: Normal. --Superior cerebellar arteries: Normal. --Posterior cerebral arteries (PCA): Normal. ANTERIOR CIRCULATION: --Intracranial internal carotid arteries: Normal. --Anterior cerebral arteries (ACA): Normal. Both A1 segments are present. Patent anterior communicating artery (a-comm). --Middle cerebral arteries (MCA): Normal. VENOUS SINUSES: As permitted by contrast timing, patent. ANATOMIC VARIANTS: None Review of the MIP images confirms the above findings. IMPRESSION: 1. No emergent large vessel occlusion or hemodynamically significant stenosis of the head or neck. 2. Mild bilateral carotid bifurcation atherosclerosis without hemodynamically significant stenosis. Aortic Atherosclerosis (ICD10-I70.0). Electronically Signed   By: Deatra Robinson M.D.   On: 08/28/2022 00:06   CT HEAD WO CONTRAST  Result Date: 08/27/2022 CLINICAL DATA:  Headache EXAM: CT HEAD WITHOUT CONTRAST TECHNIQUE: Contiguous axial images were obtained from the base of  the skull through the vertex without intravenous contrast. RADIATION DOSE REDUCTION: This exam was performed according to the departmental  dose-optimization program which includes automated exposure control, adjustment of the mA and/or kV according to patient size and/or use of iterative reconstruction technique. COMPARISON:  None Available. FINDINGS: Brain: There is no mass, hemorrhage or extra-axial collection. The size and configuration of the ventricles and extra-axial CSF spaces are normal. The brain parenchyma is normal, without acute or chronic infarction. Vascular: No abnormal hyperdensity of the major intracranial arteries or dural venous sinuses. No intracranial atherosclerosis. Skull: The visualized skull base, calvarium and extracranial soft tissues are normal. Sinuses/Orbits: No fluid levels or advanced mucosal thickening of the visualized paranasal sinuses. No mastoid or middle ear effusion. The orbits are normal. IMPRESSION: No acute intracranial abnormality. Electronically Signed   By: Deatra Robinson M.D.   On: 08/27/2022 22:54    Microbiology: Recent Results (from the past 240 hour(s))  SARS Coronavirus 2 by RT PCR (hospital order, performed in [facility] hospital lab) *cepheid single result test* Anterior Nasal Swab     Status: None   Collection Time: 08/27/22  9:49 PM   Specimen: Anterior Nasal Swab  Result Value Ref Range Status   SARS Coronavirus 2 by RT PCR NEGATIVE NEGATIVE Final    Comment: Performed at [facility], [address], [place], [place] [address]     Labs: Basic Metabolic Panel: No results for input(s): "NA", "K", "CL", "CO2", "GLUCOSE", "BUN", "CREATININE", "CALCIUM", "MG", "PHOS" in the last 168 hours. Liver Function Tests: No results for input(s): "AST", "ALT", "ALKPHOS", "BILITOT", "PROT", "ALBUMIN" in the last 168 hours. No results for input(s): "LIPASE", "AMYLASE" in the last 168 hours. No results for input(s): "AMMONIA" in the last 168 hours. CBC: No results for input(s): "WBC", "NEUTROABS", "HGB", "HCT", "MCV", "PLT" in the last 168 hours. Cardiac Enzymes: No results for input(s):  "CKTOTAL", "CKMB", "CKMBINDEX", "TROPONINI" in the last 168 hours. BNP: BNP (last 3 results) No results for input(s): "BNP" in the last 8760 hours.  ProBNP (last 3 results) No results for input(s): "PROBNP" in the last 8760 hours.  CBG: No results for input(s): "GLUCAP" in the last 168 hours.     Signed:  Zannie Cove MD.  [facility] 09/04/2022, 5:31 PM

## 2023-06-11 ENCOUNTER — Other Ambulatory Visit: Payer: Self-pay

## 2023-06-11 ENCOUNTER — Emergency Department (HOSPITAL_BASED_OUTPATIENT_CLINIC_OR_DEPARTMENT_OTHER)
Admission: EM | Admit: 2023-06-11 | Discharge: 2023-06-11 | Disposition: A | Discharge status: Home / Self Care | Attending: Emergency Medicine | Admitting: Emergency Medicine

## 2023-06-11 ENCOUNTER — Emergency Department (HOSPITAL_BASED_OUTPATIENT_CLINIC_OR_DEPARTMENT_OTHER): Admitting: Radiology

## 2023-06-11 ENCOUNTER — Encounter (HOSPITAL_BASED_OUTPATIENT_CLINIC_OR_DEPARTMENT_OTHER): Payer: Self-pay | Admitting: Emergency Medicine

## 2023-06-11 DIAGNOSIS — M25551 Pain in right hip: Secondary | ICD-10-CM | POA: Insufficient documentation

## 2023-06-11 DIAGNOSIS — Y92019 Unspecified place in single-family (private) house as the place of occurrence of the external cause: Secondary | ICD-10-CM | POA: Insufficient documentation

## 2023-06-11 DIAGNOSIS — W19XXXA Unspecified fall, initial encounter: Secondary | ICD-10-CM | POA: Diagnosis not present

## 2023-06-11 DIAGNOSIS — Z79899 Other long term (current) drug therapy: Secondary | ICD-10-CM | POA: Insufficient documentation

## 2023-06-11 DIAGNOSIS — M25561 Pain in right knee: Secondary | ICD-10-CM | POA: Insufficient documentation

## 2023-06-11 DIAGNOSIS — M7918 Myalgia, other site: Secondary | ICD-10-CM

## 2023-06-11 LAB — CBC WITH DIFFERENTIAL/PLATELET
Abs Immature Granulocytes: 0.02 10*3/uL (ref 0.00–0.07)
Basophils Absolute: 0 10*3/uL (ref 0.0–0.1)
Basophils Relative: 1 %
Eosinophils Absolute: 0.1 10*3/uL (ref 0.0–0.5)
Eosinophils Relative: 2 %
HCT: 35.7 % — ABNORMAL LOW (ref 36.0–46.0)
Hemoglobin: 11.9 g/dL — ABNORMAL LOW (ref 12.0–15.0)
Immature Granulocytes: 0 %
Lymphocytes Relative: 34 %
Lymphs Abs: 1.9 10*3/uL (ref 0.7–4.0)
MCH: 31 pg (ref 26.0–34.0)
MCHC: 33.3 g/dL (ref 30.0–36.0)
MCV: 93 fL (ref 80.0–100.0)
Monocytes Absolute: 0.6 10*3/uL (ref 0.1–1.0)
Monocytes Relative: 11 %
Neutro Abs: 2.9 10*3/uL (ref 1.7–7.7)
Neutrophils Relative %: 52 %
Platelets: 172 10*3/uL (ref 150–400)
RBC: 3.84 MIL/uL — ABNORMAL LOW (ref 3.87–5.11)
RDW: 13.5 % (ref 11.5–15.5)
WBC: 5.6 10*3/uL (ref 4.0–10.5)
nRBC: 0 % (ref 0.0–0.2)

## 2023-06-11 LAB — BASIC METABOLIC PANEL WITH GFR
Anion gap: 11 (ref 5–15)
BUN: 14 mg/dL (ref 8–23)
CO2: 22 mmol/L (ref 22–32)
Calcium: 9.6 mg/dL (ref 8.9–10.3)
Chloride: 107 mmol/L (ref 98–111)
Creatinine, Ser: 0.75 mg/dL (ref 0.44–1.00)
GFR, Estimated: >60 mL/min (ref >60)
Glucose, Bld: 90 mg/dL (ref 70–99)
Potassium: 4 mmol/L (ref 3.5–5.1)
Sodium: 140 mmol/L (ref 135–145)

## 2023-06-11 MED ORDER — FENTANYL CITRATE PF 50 MCG/ML IJ SOSY: 50.0000 ug | PREFILLED_SYRINGE | Freq: Once | INTRAMUSCULAR | Status: DC

## 2023-06-11 MED ORDER — ACETAMINOPHEN 325 MG PO TABS
650.0000 mg | ORAL_TABLET | Freq: Once | ORAL | Status: AC
  Administered 2023-06-11: 650 mg via ORAL
  Filled 2023-06-11: qty 2

## 2023-06-11 NOTE — ED Notes (Signed)
 Pt ambulated with assistance. Pt able to bear weight on right leg but pain increases with movement. MD made aware.

## 2023-06-11 NOTE — ED Triage Notes (Signed)
 Pt arrived after fall at home this am after losing her balance. R hip pain when putting weight. Small reddened area on R knee, with knee pain when elevating leg. Denies hitting her head and no LOC. No blood thinners.

## 2023-06-11 NOTE — ED Provider Notes (Signed)
 [facility] AT [facility] Provider Note   CSN: [id number] Arrival date & time: 06/11/23  1006     History  Chief Complaint  Patient presents with   Fall   Hip Pain    Viridiana Spaid is a 89 y.o. female.  HPI 89 year old female history of high blood pressure, not on blood thinners presents today after mechanical fall.  States she was in her room and turned from doing something and lost her balance.  She fell onto her right knee and then onto the ground.  She is having pain in her right hip.  She was unable to get up from the ground on her own.  She presents to the ED for evaluation of right hip pain.  She denies striking her head.  She denies loss of consciousness.    Home Medications Prior to Admission medications   Medication Sig Start Date End Date Taking? Authorizing Provider  dasatinib  (SPRYCEL ) 20 MG tablet Take 20 mg by mouth 2 (two) times daily. 05/28/23 05/27/24 Yes [provider]  acetaminophen  (TYLENOL ) 325 MG tablet Take 650 mg by mouth every 6 (six) hours as needed for fever.    [provider]  atorvastatin (LIPITOR) 20 MG tablet Take 20 mg by mouth daily.    [provider]  denosumab (PROLIA) 60 MG/ML SOSY injection Inject 60 mg into the skin every 6 (six) months.    [provider]  gabapentin  (NEURONTIN ) 400 MG capsule Take 1 capsule (400 mg total) by mouth at bedtime. 08/28/22   Deforest Fast, MD  lisinopril  (ZESTRIL ) 20 MG tablet Take 1 tablet (20 mg total) by mouth daily. 08/28/22   Deforest Fast, MD  metoprolol  succinate (TOPROL -XL) 25 MG 24 hr tablet Take 25 mg by mouth at bedtime. 10/20/17   [provider]      Allergies    Patient has no known allergies.    Review of Systems   Review of Systems  Physical Exam Updated Vital Signs BP (!) 160/60   Pulse 64   Temp 98 F (36.7 C) (Oral)   Resp 18   Ht 1.626 m (5\' 4" )   Wt 74.4 kg   SpO2 96%   BMI 28.15 kg/m  Physical Exam Vitals  reviewed.  HENT:     Head: Normocephalic.     Nose: Nose normal.     Mouth/Throat:     Mouth: Mucous membranes are moist.  Eyes:     Pupils: Pupils are equal, round, and reactive to light.  Cardiovascular:     Rate and Rhythm: Normal rate and regular rhythm.     Pulses: Normal pulses.  Pulmonary:     Effort: Pulmonary effort is normal.     Breath sounds: Normal breath sounds.  Abdominal:     General: Abdomen is flat.  Musculoskeletal:     Cervical back: Normal range of motion.     Comments: Contusion right knee Mild tenderness palpation over right hip and right sacral area No obvious external signs of trauma No point tenderness over lumbar spine  Skin:    General: Skin is warm.     Capillary Refill: Capillary refill takes less than 2 seconds.  Neurological:     General: No focal deficit present.     Mental Status: She is alert.  Psychiatric:        Mood and Affect: Mood normal.     ED Results / Procedures / Treatments   Labs (all labs ordered are  listed, but only abnormal results are displayed) Labs Reviewed  CBC WITH DIFFERENTIAL/PLATELET - Abnormal; Notable for the following components:      Result Value   RBC 3.84 (*)    Hemoglobin 11.9 (*)    HCT 35.7 (*)    All other components within normal limits  BASIC METABOLIC PANEL WITH GFR    EKG None  Radiology DG Hip Unilat W or Wo Pelvis 2-3 Views Right Result Date: 06/11/2023 CLINICAL DATA:  Fall, pain EXAM: DG HIP (WITH OR WITHOUT PELVIS) 2-3V RIGHT COMPARISON:  11/07/2021 FINDINGS: Degenerative changes and postoperative findings of the lumbosacral spine as before. Bones are osteopenic. Bony pelvis and hips are symmetric and intact. No acute osseous finding, fracture or malalignment. No diastasis. IMPRESSION: Osteopenia and degenerative changes. No acute finding by plain radiography. Electronically Signed   By: Melven Stable.  Shick M.D.   On: 06/11/2023 12:49   DG Lumbar Spine 2-3 Views Result Date: 06/11/2023 CLINICAL  DATA:  Fall, pain EXAM: LUMBAR SPINE - 2-3 VIEW COMPARISON:  11/07/2021 FINDINGS: Stable alignment and degenerative changes. Interbody fusion hardware at L4-5. Facet arthropathy spanning L2-S1 posteriorly. Preserved vertebral body heights. No acute compression fracture, wedge-shaped deformity or focal kyphosis. Similar mild SI joint arthropathy bilaterally. Abdominal atherosclerosis present. Remote cholecystectomy. IMPRESSION: Degenerative and postoperative changes as above. No acute finding by plain radiography. Electronically Signed   By: Melven Stable.  Shick M.D.   On: 06/11/2023 12:47   DG Knee Complete 4 Views Right Result Date: 06/11/2023 CLINICAL DATA:  Recent fall, pain, injury EXAM: RIGHT KNEE - COMPLETE 4+ VIEW COMPARISON:  06/11/2023 FINDINGS: No evidence of fracture, dislocation, or joint effusion. No evidence of significant arthropathy or other focal bone abnormality. Soft tissues are unremarkable. IMPRESSION: No acute finding by plain radiography Electronically Signed   By: Melven Stable.  Shick M.D.   On: 06/11/2023 12:45    Procedures Procedures    Medications Ordered in ED Medications  acetaminophen  (TYLENOL ) tablet 650 mg (650 mg Oral Given 06/11/23 1129)    ED Course/ Medical Decision Making/ A&P Clinical Course as of 06/11/23 1354  Fri Jun 11, 2023  1110 DG Hip Unilat W or Wo Pelvis 2-3 Views Right [DR]  1258 Hip, lumbar spine, and knee x-Idan Prime are reviewed and interpreted no evidence of acute abnormality is noted and radiologist interpretation concurs [DR]    Clinical Course User Index [DR] Auston Blush, MD                                 Medical Decision Making Amount and/or Complexity of Data Reviewed Labs: ordered. Radiology: ordered. Decision-making details documented in ED Course.  Risk OTC drugs.   21-year-old female with mechanical fall today presents complaining of some right buttock pain.  She Discussed scribes also some knee pain where she fell directly on the knee.  She is  having some pain when she tries to put weight on the right side although the pain is described as being more of a pulling sensation from the right low back down to the buttock.  On her exam there is not point tenderness over the right hip although she has some pain with full active range of motion of the right hip.  There is a contusion on the knee and no tenderness noted or fluid noted.  There is no point tenderness noted on the lumbar spine or obvious deformity over the pelvis. Plain x-rays were obtained and show no  fracture on plain films.  As patient does not have point tenderness over her hip, we will try to ambulate her.  Patient has a cane at home.  I do not think that she needs to have further imaging at this time as I have a low index of suspicion of hip fracture        Final Clinical Impression(s) / ED Diagnoses Final diagnoses:  Buttock pain  Fall, initial encounter    Rx / DC Orders ED Discharge Orders     None         Auston Blush, MD 06/11/23 1354

## 2023-06-11 NOTE — Discharge Instructions (Signed)
 Please use acetaminophen  as needed for pain at home. Please walk only with assistance or with your walker. Return if the pain is worsening or more severe Please recheck with your doctor on Monday

## 2023-06-11 NOTE — ED Notes (Signed)
Reviewed discharge instructions and home care with pt. Pt verbalized understanding and had no further questions. Pt exited ED without complications.
